# Patient Record
Sex: Female | Born: 1990 | Race: Black or African American | Hispanic: No | Marital: Married
Health system: Midwestern US, Community
[De-identification: ages and names within clinical notes are randomized; demographics above are authoritative.]

## PROBLEM LIST (undated history)

## (undated) DIAGNOSIS — K219 Gastro-esophageal reflux disease without esophagitis: Secondary | ICD-10-CM

## (undated) HISTORY — PX: MOUTH SURGERY: SHX715

## (undated) HISTORY — PX: HERNIA REPAIR: SHX51

## (undated) HISTORY — PX: UPPER GI ENDOSCOPY: SHX6162

---
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
## 2009-03-25 LAB — URINALYSIS W/ RFLX MICROSCOPIC
Bilirubin: NEGATIVE
Blood: NEGATIVE
Glucose: NEGATIVE MG/DL
Ketone: NEGATIVE MG/DL
Leukocyte Esterase: NEGATIVE
Nitrites: NEGATIVE
Protein: NEGATIVE MG/DL
Specific gravity: 1.025 (ref 1.003–1.030)
Urobilinogen: 0.2 EU/DL (ref 0.2–1.0)
pH (UA): 6.5 (ref 5.0–8.0)

## 2009-03-26 LAB — CULTURE, URINE
Culture result:: NO GROWTH
Culture: NO GROWTH

## 2009-03-31 NOTE — ED Provider Notes (Signed)
[facility]                      EMERGENCY DEPARTMENT TREATMENT REPORT           PRELIMINARY (DRAFT) -- FINAL REPORT  in HPF   NAME:  [name] [name], [name] [name]             SEX:            F   DATE:  03/31/2009                     DOB:            05/25/1991   MR#    [id number]                       TIME SEEN       12:52 P   ACCT#  [id number]                      ROOM:           ER  [id number]       cc:           CHIEF COMPLAINT:  Chest pain.       HISTORY:  This is a 18 year old female with intermittent chest pain since   March 2010.  She has been diagnosed with costochondritis.  It flares up   fairly frequently.  She saw her doctor yesterday and was given some type of   cream to apply to her chest wall.  She has not had any cough.  She has not   had any shortness of breath.  There has been no significant injury or   trauma.  She has not traveled anywhere.  She states she was told to come   back today and that they would ultrasound her chest if she was not feeling   better.  She was not able to get there today due to the traffic and   presents for evaluation at this time.       REVIEW OF SYSTEMS:   CONSTITUTIONAL:  No fever, chills, or weight loss.   ENT:  No sore throat, runny nose, or other URI symptoms.   RESPIRATORY:  No cough.  No shortness of breath.   CARDIOVASCULAR:  No chest pressure or palpitations.   GASTROINTESTINAL:  No vomiting, diarrhea, or abdominal pain.   MUSCULOSKELETAL:  Denies any leg pain or swelling.   INTEGUMENTARY:  No rashes.   NEUROLOGICAL:  No headaches, sensory or motor symptoms.       PAST MEDICAL HISTORY:  Reflux.  Last menstrual period was last week.       MEDICATIONS:  Reviewed.       ALLERGIES:  None.       SOCIAL HISTORY:  Negative for tobacco.  No history of travel.       FAMILY HISTORY:  Noncontributory.       PHYSICAL EXAMINATION:   GENERAL:  This is a well-developed female sitting in bed.    VITALS:  Blood pressure 107/63, pulse 64, respirations 18, temperature   98.8, O2 saturation 100% on room air.   HEENT:  Conjunctivae are clear.  Mouth, mucous membranes are pink.  Throat   is clear.   NECK:  Supple, nontender, symmetrical, no masses or JVD, trachea midline.   Thyroid not enlarged, nodular, or tender.   LUNGS:  Clear to auscultation.  Symmetrical  expansion.  She is not   tachypneic.  She is not dyspneic.   HEART:  Regular rate and rhythm.   CHEST:  She has reproducible tenderness over the left anterior chest wall   along the sternocostal margins, which does reproduce her pain and   apparently this is the pain that she has been experiencing now since March,   2010.  I cannot appreciate any obvious acute bony or soft tissue   abnormalities.   ABDOMEN:  Completely soft and nontender.   BACK:  Nontender.   EXTREMITIES:  Warm and dry.  Calves soft and nontender.   SKIN:  Without rash.   NEUROLOGIC:  She is awake, alert, and oriented.       IMPRESSION/MANAGEMENT PLAN:  This is a 18 year old female who presents for   evaluation of recurrent left anterior chest wall pain.  Has been evaluated   for it most recently yesterday.  At this time a chest x-ray will be   obtained to make sure there is no acute pulmonary process.       DIAGNOSTIC STUDIES:  Chest x-ray was read as a normal study per radiology.           COURSE IN THE EMERGENCY DEPARTMENT:  Findings were discussed.  At this time   discussed this does appear to be costochondritis.  I did discuss with her   the need for followup and further evaluation including rheumatologic   evaluation to see why she keeps getting these episodes.  She will need to   take this up with Dr. Doristine Devoid, her family doctor.  We will start her on   ibuprofen.  Rest.  To certainly return at any time worsening or new   concerns.       FINAL DIAGNOSIS:  Evaluation of acute recurrent precordial pain - probable   costochondritis.        DISPOSITION/PLAN:  The patient was discharged home in stable condition to   follow up as above.  The patient was examined by myself and Dr. Dixie Dials, who agrees with my assessment and plan.                               ____________________________   Dixie Dials, M.D.       Dictated By:  Fara Chute, PA-C       My signature above authenticates this document and my orders, the final   diagnosis(es), discharge prescription(s) and instructions in the [facility] record.       st  D:  03/31/2009  T:  04/03/2009  9:39 A   [id number]

## 2009-04-17 LAB — HIV 1/2 AB SCREEN W RFLX CONFIRM
HIV 1/2 Interpretation: NONREACTIVE
HIV1/2 INTERPRETATION, HHIVI: NONREACTIVE

## 2009-04-17 LAB — METABOLIC PANEL, COMPREHENSIVE
A-G Ratio: 1.2 (ref 0.8–1.7)
ALT (SGPT): 30 U/L (ref 30–65)
AST (SGOT): 19 U/L (ref 15–37)
Albumin: 4.4 g/dL (ref 3.4–5.0)
Alk. phosphatase: 81 U/L (ref 50–136)
Anion gap: 11 mmol/L (ref 5–15)
BUN/Creatinine ratio: 13 (ref 12–20)
BUN: 9 MG/DL (ref 7–18)
Bilirubin, total: 0.5 MG/DL (ref 0.1–0.9)
CO2: 24 MMOL/L (ref 21–32)
Calcium: 9.3 MG/DL (ref 8.4–10.4)
Chloride: 101 MMOL/L (ref 100–108)
Creatinine: 0.7 MG/DL (ref 0.6–1.3)
GFR est AA: >60 mL/min/{1.73_m2} (ref >60)
GFR est non-AA: >60 mL/min/{1.73_m2} (ref >60)
Globulin: 3.6 g/dL (ref 2.0–4.0)
Glucose: 89 MG/DL (ref 74–99)
Potassium: 3.7 MMOL/L (ref 3.5–5.5)
Protein, total: 8 g/dL (ref 6.4–8.2)
Sodium: 136 MMOL/L (ref 136–145)

## 2009-04-17 LAB — DRUG SCREEN UR - W/ CONFIRM
AMPHETAMINES: NEGATIVE
BARBITURATES: NEGATIVE
BENZODIAZEPINES: NEGATIVE
COCAINE: NEGATIVE
METHADONE: NEGATIVE
OPIATES: NEGATIVE
PCP(PHENCYCLIDINE): NEGATIVE
THC (TH-CANNABINOL): POSITIVE — AB

## 2009-04-17 LAB — CBC WITH AUTOMATED DIFF
ABS. BASOPHILS: 0 10*3/uL (ref 0.0–0.1)
ABS. LYMPHOCYTES: 1.5 10*3/uL (ref 0.8–3.5)
ABS. MONOCYTES: 0.3 10*3/uL (ref 0–1.0)
ABS. NEUTROPHILS: 2.1 10*3/uL (ref 1.8–8.0)
BASOPHILS: 0 % (ref 0–3)
EOSINOPHILS: 1 % (ref 0–5)
HCT: 35 % — ABNORMAL LOW (ref 36.0–46.0)
HGB: 11.1 g/dL — ABNORMAL LOW (ref 12.0–16.0)
LYMPHOCYTES: 37 % (ref 20–51)
MCH: 30.5 PG (ref 25.0–35.0)
MCHC: 31.8 g/dL (ref 31.0–37.0)
MCV: 95.9 FL (ref 78.0–102.0)
MONOCYTES: 8 % (ref 2–9)
MPV: 9.8 FL (ref 7.4–10.4)
NEUTROPHILS: 54 % (ref 42–75)
PLATELET: 167 10*3/uL (ref 130–400)
RBC: 3.65 M/uL — ABNORMAL LOW (ref 4.10–5.10)
RDW: 13.2 % (ref 11.5–14.5)
WBC: 4 10*3/uL — ABNORMAL LOW (ref 4.5–13.0)

## 2009-04-17 LAB — CK: CK: 85 U/L (ref 21–215)

## 2009-04-17 LAB — IRON PROFILE
Iron % saturation: 45 % (ref 20–50)
Iron: 126 ug/dL (ref 35–150)
TIBC: 283 ug/dL (ref 250–450)

## 2009-04-17 LAB — T3, FREE: Triiodothyronine (T3), free: 3.3 PG/ML (ref 2.3–4.2)

## 2009-04-17 LAB — VITAMIN B12 & FOLATE
Folate: 13.7 ng/mL (ref 5.38–24.0)
Vitamin B12: 512 pg/mL (ref 211–911)

## 2009-04-17 LAB — SED RATE (ESR): Sed rate (ESR): 12 MM/HR (ref 0–20)

## 2009-04-17 LAB — MAGNESIUM: Magnesium: 1.7 MG/DL — ABNORMAL LOW (ref 1.8–2.4)

## 2009-04-17 LAB — T4, FREE: T4, Free: 0.9 NG/DL (ref 0.7–1.5)

## 2009-04-17 LAB — FERRITIN: Ferritin: 45 NG/ML (ref 10–291)

## 2009-04-18 LAB — ANA BY MULTIPLEX FLOW IA, QL
ANA, Direct: NOT DETECTED
ANA: NOT DETECTED

## 2009-04-18 LAB — CULTURE, URINE
Culture result:: NO GROWTH
Culture: NO GROWTH

## 2009-04-18 LAB — CYCLIC CITRUL PEPTIDE AB, IGG: Cyclic Citrullinated Peptide Ab: 13 units (ref <=39)

## 2009-04-18 LAB — VITAMIN D, 25 HYDROXY: Vitamin D 25-Hydroxy: 15 ng/mL — ABNORMAL LOW (ref 30–80)

## 2009-04-19 LAB — CANNABINOIDS CONFIRM., UR: Cannabinoids confirm, urine: POSITIVE

## 2014-02-08 ENCOUNTER — Encounter (HOSPITAL_BASED_OUTPATIENT_CLINIC_OR_DEPARTMENT_OTHER): Payer: Self-pay | Admitting: Emergency Medicine

## 2014-02-08 ENCOUNTER — Emergency Department (HOSPITAL_BASED_OUTPATIENT_CLINIC_OR_DEPARTMENT_OTHER)
Admission: EM | Admit: 2014-02-08 | Discharge: 2014-02-08 | Disposition: A | Discharge status: Home / Self Care | Payer: Self-pay | Attending: Emergency Medicine | Admitting: Emergency Medicine

## 2014-02-08 DIAGNOSIS — L03317 Cellulitis of buttock: Principal | ICD-10-CM

## 2014-02-08 DIAGNOSIS — L0231 Cutaneous abscess of buttock: Secondary | ICD-10-CM | POA: Insufficient documentation

## 2014-02-08 DIAGNOSIS — Z8719 Personal history of other diseases of the digestive system: Secondary | ICD-10-CM | POA: Insufficient documentation

## 2014-02-08 HISTORY — DX: Gastro-esophageal reflux disease without esophagitis: K21.9

## 2014-02-08 LAB — CBG MONITORING, ED: Glucose-Capillary: 81 mg/dL (ref 70–99)

## 2014-02-08 MED ORDER — LIDOCAINE-EPINEPHRINE 2 %-1:100000 IJ SOLN
1.7000 mL | Freq: Once | INTRAMUSCULAR | Status: AC
  Administered 2014-02-08: 1.7 mL via INTRADERMAL
  Filled 2014-02-08: qty 1

## 2014-02-08 MED ORDER — SULFAMETHOXAZOLE-TMP DS 800-160 MG PO TABS
1.0000 | ORAL_TABLET | Freq: Once | ORAL | Status: AC
  Administered 2014-02-08: 1 via ORAL
  Filled 2014-02-08: qty 1

## 2014-02-08 MED ORDER — HYDROCODONE-ACETAMINOPHEN 5-325 MG PO TABS
2.0000 | ORAL_TABLET | Freq: Once | ORAL | Status: AC
  Administered 2014-02-08: 2 via ORAL
  Filled 2014-02-08: qty 2

## 2014-02-08 MED ORDER — HYDROCODONE-ACETAMINOPHEN 5-325 MG PO TABS: ORAL_TABLET | ORAL | Status: DC

## 2014-02-08 MED ORDER — SULFAMETHOXAZOLE-TRIMETHOPRIM 800-160 MG PO TABS: 1.0000 | ORAL_TABLET | Freq: Two times a day (BID) | ORAL | Status: DC

## 2014-02-08 NOTE — Discharge Instructions (Signed)
Take your antibiotics as directed and to completion. You should never have any leftover antibiotics! Push fluids and stay well hydrated.   Perform sitz bath frequently and after every bowel movement.  Do not hesitate to return to the emergency room for any new, worsening or concerning symptoms.  Please obtain primary care using resource guide below. But the minute you were seen in the emergency room and that they will need to obtain records for further outpatient management.   Abscess Care After An abscess (also called a boil or furuncle) is an infected area that contains a collection of pus. Signs and symptoms of an abscess include pain, tenderness, redness, or hardness, or you may feel a moveable soft area under your skin. An abscess can occur anywhere in the body. The infection may spread to surrounding tissues causing cellulitis. A cut (incision) by the surgeon was made over your abscess and the pus was drained out. Gauze may have been packed into the space to provide a drain that will allow the cavity to heal from the inside outwards. The boil may be painful for 5 to 7 days. Most people with a boil do not have high fevers. Your abscess, if seen early, may not have localized, and may not have been lanced. If not, another appointment may be required for this if it does not get better on its own or with medications. HOME CARE INSTRUCTIONS   Only take over-the-counter or prescription medicines for pain, discomfort, or fever as directed by your caregiver.  When you bathe, soak and then remove gauze or iodoform packs at least daily or as directed by your caregiver. You may then wash the wound gently with mild soapy water. Repack with gauze or do as your caregiver directs. SEEK IMMEDIATE MEDICAL CARE IF:   You develop increased pain, swelling, redness, drainage, or bleeding in the wound site.  You develop signs of generalized infection including muscle aches, chills, fever, or a general ill  feeling.  An oral temperature above 102 F (38.9 C) develops, not controlled by medication. See your caregiver for a recheck if you develop any of the symptoms described above. If medications (antibiotics) were prescribed, take them as directed. Document Released: 12/09/2004 Document Revised: 08/15/2011 Document Reviewed: 08/06/2007 [facility] Patient Information 2015 [place], [place]. This information is not intended to replace advice given to you by your health care provider. Make sure you discuss any questions you have with your health care provider.    Emergency Department Resource Guide 1) Find a Doctor and Pay Out of Pocket Although you won't have to find out who is covered by your insurance plan, it is a good idea to ask around and get recommendations. You will then need to call the office and see if the doctor you have chosen will accept you as a new patient and what types of options they offer for patients who are self-pay. Some doctors offer discounts or will set up payment plans for their patients who do not have insurance, but you will need to ask so you aren't surprised when you get to your appointment.  2) Contact Your Local Health Department Not all health departments have doctors that can see patients for sick visits, but many do, so it is worth a call to see if yours does. If you don't know where your local health department is, you can check in your phone book. The CDC also has a tool to help you locate your state's health department, and many state websites  also have listings of all of their local health departments.  3) Find a Walk-in Clinic If your illness is not likely to be very severe or complicated, you may want to try a walk in clinic. These are popping up all over the country in pharmacies, drugstores, and shopping centers. They're usually staffed by nurse practitioners or physician assistants that have been trained to treat common illnesses and complaints. They're usually  fairly quick and inexpensive. However, if you have serious medical issues or chronic medical problems, these are probably not your best option.  No Primary Care Doctor: - Call Health Connect at  [phone] - they can help you locate a primary care doctor that  accepts your insurance, provides certain services, etc. - Physician Referral Service- [phone]  Chronic Pain Problems: Organization         Address  Phone   Notes  [facility]  [phone] Patients need to be referred by their primary care doctor.   Medication Assistance: Organization         Address  Phone   Notes  [facility] Medication [facility] [address] [place] [place], [place] [address] [phone] --Must be a resident of [facility] -- Must have NO insurance coverage whatsoever (no Medicaid/ Medicare, etc.) -- The pt. MUST have a primary care doctor that directs their care regularly and follows them in the community   [facility]  [phone]   [employer]  [phone]    Agencies that provide inexpensive medical care: Organization         Address  Phone   Notes  [facility]  [phone]   [facility]    [phone]   [facility] [address] [place], [place] [address] [phone]   [facility] [address] [place], [place] [phone]   [facility]anned Parenthood    [phone]   [facility]    [phone]   [facility]  [address], [place] Phone:  [phone], Fax:  [phone] Hours of Operation:  9 am - 6 pm, M-F.  Also accepts Medicaid/Medicare and self-pay.  [facility]  [address], [place] Phone: [phone], Fax: [phone]. Hours of Operation:  8:30 am - 5:30 pm, M-F.  Also accepts Medicaid and self-pay.  [facility] [place] [address] [place] Phone: [phone]   [facility] [address] Natasha Bence [place], [place] [phone], Ext. [phone] Mondays & Thursdays: 7-9 AM.  First 15 patients are seen on a first come, first serve basis.    Medicaid-accepting [facility] Providers:  Organization         Address  Phone   Notes  [facility] [address], [place] [phone] Also accepts self-pay patients.  [facility] [address] Laurell Josephs [place], [place]  [phone]   [facility] [address], [place] [phone]   [facility] [address], [place] [phone]   Renaye Rakers [address], [place]   [phone] Only accepts [place] Access [place] patients after they have their name applied to their card.   Self-Pay (no insurance) in [facility]:  [employer]  Notes  Sickle Cell Patients, [facility] [address] [place], [place] [phone]   [facility] [address] [place], [place] [phone]   [facility] [place]  [address], [place] [phone]   [facility]/Dr. Osei-Bonsu  [address] [place] or [address], [place] [phone] Phone number for both [place] and [place] locations is the same.  [facility] [address], [place] [phone]   [facility] [address] [place] or [address] [phone]   [facility] [address] [place] [phone], phone; [phone], fax Sees patients 1st and 3rd Saturday of every month.  Must not qualify for public or private insurance (i.e. Medicaid, Medicare, [place] Health Choice, Veterans' Benefits)  Household income should be no more than 200% of the poverty level The clinic cannot treat you if  you are pregnant or think you are pregnant  Sexually transmitted diseases are not treated at the clinic.    Dental Care: Organization         Address  Phone  Notes  [facility] Department of [facility] [address] [place], [place] [phone] Accepts children up to age 3 who are enrolled in [place] or [place] Health Choice; pregnant women with a Medicaid card; and children who have applied for Medicaid or [place] Health Choice, but were declined, whose parents can pay a reduced fee at time of service.  [facility] Department of [facility]  [address], [place] [phone] Accepts children up to age 65 who are enrolled in [place] or [place] Health Choice; pregnant women with a Medicaid card; and children who have applied for Medicaid or [place] Health Choice, but were declined, whose parents can pay a reduced fee at time of service.  [facility]  [address] [place], [place] [phone] Patients are seen by appointment only. Walk-ins are not accepted. [facility] will see patients 4 years of age and older. Monday - Tuesday (8am-5pm) Most Wednesdays (8:30-5pm) $30 per visit, cash only  [facility]  [address], [facility] [phone] Patients are seen by appointment only. Walk-ins are not accepted. [facility] will see patients 61 years of age and older. One Wednesday Evening (Monthly: Volunteer Based).  $30 per visit, cash only  [employer]  [phone] for adults; Children under age 83, call Graduate Pediatric Dentistry at [phone]. Children aged 60-14, please call [phone] to request a pediatric application.  Dental services are provided in all areas of dental care including fillings, crowns and bridges, complete and partial dentures, implants, gum treatment, root canals, and extractions. Preventive care is also provided. Treatment is  provided to both adults and children. Patients are selected via a lottery and there is often a waiting list.   [facility] [address] [place]  [phone] [email or web address]   [facility] [address] [place], [place] [phone], Ext. [phone] Second and Fourth Thursday of each month, opens at 6:30 AM; Clinic ends at 9 AM.  Patients are seen on a first-come first-served basis, and a limited number are seen during each clinic.   [facility]  [address] Ether Griffins [place], [place] [phone]  Eligibility Requirements You must have lived in [place], [place], or [place] counties for at least the last three months.   You cannot be eligible for state or federal sponsored [place], including [employer], [place], or [employer].   You generally cannot be eligible for healthcare insurance through your employer.    How to apply: Eligibility screenings are held every Tuesday and Wednesday afternoon from 1:00 pm until 4:00 pm. You do not need an appointment for the interview!  [facility] [address] [place], [place] [phone]   [facility] Health Department  [phone]   [facility] Health Department  [phone]   [facility] Health Department  [phone]    Behavioral Health Resources in the Community: Intensive Outpatient Programs Organization         Address  Phone  Notes  [facility] Services [address], [place], [place] [phone]   [facility] Outpatient [address], [place], [place] [phone]   [facility] [address], [place], [place]  [phone]   [facility] Mental Health [address]  [place], [place] [phone] or [phone]   Substance Abuse Resources Organization         Address  Phone  Notes  [facility]  [phone]   [facility]  [phone]   [facility]  [facility]  [phone]   Floydene Flock  [phone]   Residential & Outpatient Substance Abuse Program  [phone]   Psychological Services Organization         Address  Phone  Notes  [facility] Behavioral Health  [phone]   [facility]  [phone]   [facility] [address] [place] [phone] or [phone]    Mobile Crisis Teams Organization         Address  Phone  Notes  [facility], Mobile Crisis Care Unit  [phone]   [facility]  [address] [place], [place] [phone]   Doristine Locks [address] [place] [phone]    Self-Help/Support Groups Organization         Address  Phone             Notes  [facility] [place] - variety of support groups  336- I7437963 Call for more information  Narcotics Anonymous (NA), [facility] [address], [employer] [place]  2 meetings at this location   Statistician         Address  Phone  Notes  [facility] [address],    [place]  [phone]   [facility]  [address] [place] [address], [place], [place] [phone]   [facility] [address] , [place] [phone] Admissions: 8am-3pm M-F  [facility] [address]    [place], [place] [phone]   [facility] [address] Starling Manns [place], [place] [phone]   [facility] [address],  [place], [place] [phone]   [facility] - Intensive Outpatient [address] Laurell Joseph[address], [place], [place] [phone]   [facility] (Addiction Recovery [facility] [address] [place]  [place], [place] [phone] or [phone]   [facility] ([facility]) [address] [place], [place] [phone] Accepts Medicaid  [facility] [place] [place] [address]  [place] [phone] Substance Abuse/Addiction Treatment     [facility] Resources Organization  Address  Phone  Notes  °[facility]  ([phone]) [phone]   °Julie Brannon, PhD [address] [place], [place]   ([phone]) [phone] or (336) [phone]   ° Behavioral   [address] °[place], [employer] ([phone]   °[facility] [address], [place], [place] [phone]) [phone] Insurance/Medicaid/sponsorship through [facility]  °[facility] [address]                                    [place], [place] [phone]) [phone] Therapy/tele-psych/case  °[facility] [address]  ° [place], [place] [phone]    °Dr. Arfeen  ([phone]   °[facility] [place]  [employer] [place] [facility] 1) [address],  °2) [address], [place] °3)  [address], [place] [phone]) [phone] °(336) [phone] ° °(336) [phone]   °[place] Child Abuse Hotline (336) 342-1394 or (336) [phone] (After Hours)    ° ° ° ° °

## 2014-02-08 NOTE — ED Provider Notes (Signed)
CSN: [id number]     Arrival date & time 02/08/14  1400 History   First MD Initiated Contact with Patient 02/08/14 1432     Chief Complaint  Patient presents with  . Abscess     (Consider location/radiation/quality/duration/timing/severity/associated sxs/prior Treatment) HPI  Alysiana Ethridge is a 23 y.o. female complaining of abscess to left buttock worsening over the course of 4 days with severe pain. Patient denies any pain with defecation, fever, chills. States that this happens frequently. Denies history of diabetes.   Past Medical History  Diagnosis Date  . GERD (gastroesophageal reflux disease)    Past Surgical History  Procedure Laterality Date  . Hernia repair    . Mouth surgery     No family history on file. History  Substance Use Topics  . Smoking status: Never Smoker   . Smokeless tobacco: Not on file  . Alcohol Use: No   OB History   Grav Para Term Preterm Abortions TAB SAB Ect Mult Living                 Review of Systems  10 systems reviewed and found to be negative, except as noted in the HPI.   Allergies  Review of patient's allergies indicates no known allergies.  Home Medications   Prior to Admission medications   Medication Sig Start Date End Date Taking? Authorizing Provider  HYDROcodone-acetaminophen (NORCO/VICODIN) 5-325 MG per tablet Take 1-2 tablets by mouth every 6 hours as needed for pain. 02/08/14   Josee Speece, PA-C  sulfamethoxazole-trimethoprim (SEPTRA DS) 800-160 MG per tablet Take 1 tablet by mouth every 12 (twelve) hours. 02/08/14   Keilen Kahl, PA-C   BP 115/78  Pulse 78  Temp(Src) 98.3 F (36.8 C) (Oral)  Resp 18  Ht  (1.626 m)  Wt 104 lb (47.174 kg)  BMI 17.84 kg/m2  SpO2 100%  LMP 01/24/2014 Physical Exam  Nursing note and vitals reviewed. Constitutional: She is oriented to person, place, and time. She appears well-developed and well-nourished. No distress.  HENT:  Head: Normocephalic.  Eyes: Conjunctivae  and EOM are normal.  Cardiovascular: Normal rate.   Pulmonary/Chest: Effort normal. No stridor.  Abdominal: Soft. She exhibits no distension and no mass. There is no tenderness. There is no rebound and no guarding.  Genitourinary:     Musculoskeletal: Normal range of motion.  Neurological: She is alert and oriented to person, place, and time.  Psychiatric: She has a normal mood and affect.    ED Course  Procedures (including critical care time)  INCISION AND DRAINAGE Performed by: Wynetta Emery Consent: Verbal consent obtained. Risks and benefits: risks, benefits and alternatives were discussed Type: abscess  Body area: left gluteuas  Anesthesia: local infiltration  Incision was made with a scalpel.  Local anesthetic: lidocaine 2% with epinephrine  Anesthetic total: 2 ml  Complexity: complex Blunt dissection to break up loculations  Drainage: purulent  Drainage amount: 50 mL  Packing material: None   Patient tolerance: Patient tolerated the procedure well with no immediate complications.    Labs Review Labs Reviewed  CBG MONITORING, ED    Imaging Review No results found.   EKG Interpretation None      MDM   Final diagnoses:  Abscess, gluteal, left    Filed Vitals:   02/08/14 1408  BP: 115/78  Pulse: 78  Temp: 98.3 F (36.8 C)  TempSrc: Oral  Resp: 18  Height:  (1.626 m)  Weight: 104 lb (47.174 kg)  SpO2: 100%  Medications  lidocaine-EPINEPHrine (XYLOCAINE W/EPI) 2 %-1:100000 (with pres) injection 1.7 mL (not administered)  sulfamethoxazole-trimethoprim (BACTRIM DS) 800-160 MG per tablet 1 tablet (not administered)  HYDROcodone-acetaminophen (NORCO/VICODIN) 5-325 MG per tablet 2 tablet (2 tablets Oral Given 02/08/14 1505)    Keasia Dubose is a 23 y.o. female presenting with Left gluteal abscess. I and D. performed. Patient will be given antibiotics states that she gets these frequently.    Evaluation does not show  pathology that would require ongoing emergent intervention or inpatient treatment. Pt is hemodynamically stable and mentating appropriately. Discussed findings and plan with patient/guardian, who agrees with care plan. All questions answered. Return precautions discussed and outpatient follow up given.   New Prescriptions   HYDROCODONE-ACETAMINOPHEN (NORCO/VICODIN) 5-325 MG PER TABLET    Take 1-2 tablets by mouth every 6 hours as needed for pain.   SULFAMETHOXAZOLE-TRIMETHOPRIM (SEPTRA DS) 800-160 MG PER TABLET    Take 1 tablet by mouth every 12 (twelve) hours.         Wynetta Emery, PA-C 02/08/14 1538

## 2014-02-08 NOTE — ED Notes (Signed)
Pt c/o abscess to LT buttock x 4 days

## 2014-02-09 NOTE — ED Provider Notes (Signed)
Medical screening examination/treatment/procedure(s) were performed by non-physician practitioner and as supervising physician I was immediately available for consultation/collaboration.   EKG Interpretation None        Nelle Sayed, MD 02/09/14 0200 

## 2014-03-21 ENCOUNTER — Ambulatory Visit: Payer: Self-pay | Admitting: Physician Assistant

## 2014-04-02 ENCOUNTER — Ambulatory Visit: Payer: Self-pay | Admitting: Physician Assistant

## 2014-04-02 DIAGNOSIS — Z0289 Encounter for other administrative examinations: Secondary | ICD-10-CM

## 2014-04-03 ENCOUNTER — Telehealth: Payer: Self-pay | Admitting: *Deleted

## 2014-04-03 NOTE — Telephone Encounter (Signed)
Pt did not show for appointment 04/02/2014 at 2:30 to establish care. PATIENT HAS NO SHOW TWICE FOR NEW PATIENT APPOINTMENT WITH [facility]; NO FURTHER APPOINTMENTS ARE TO BE SCHEDULED PER PROVIDER.

## 2014-05-05 ENCOUNTER — Telehealth: Payer: Self-pay | Admitting: *Deleted

## 2014-05-06 NOTE — Telephone Encounter (Signed)
ERROR

## 2015-01-29 ENCOUNTER — Emergency Department (HOSPITAL_BASED_OUTPATIENT_CLINIC_OR_DEPARTMENT_OTHER)
Admission: EM | Admit: 2015-01-29 | Discharge: 2015-01-29 | Disposition: A | Discharge status: Home / Self Care | Payer: Self-pay | Attending: Emergency Medicine | Admitting: Emergency Medicine

## 2015-01-29 ENCOUNTER — Encounter (HOSPITAL_BASED_OUTPATIENT_CLINIC_OR_DEPARTMENT_OTHER): Payer: Self-pay | Admitting: *Deleted

## 2015-01-29 DIAGNOSIS — Y929 Unspecified place or not applicable: Secondary | ICD-10-CM | POA: Insufficient documentation

## 2015-01-29 DIAGNOSIS — Y9389 Activity, other specified: Secondary | ICD-10-CM | POA: Insufficient documentation

## 2015-01-29 DIAGNOSIS — S0001XA Abrasion of scalp, initial encounter: Secondary | ICD-10-CM | POA: Insufficient documentation

## 2015-01-29 DIAGNOSIS — S0003XA Contusion of scalp, initial encounter: Secondary | ICD-10-CM | POA: Insufficient documentation

## 2015-01-29 DIAGNOSIS — W228XXA Striking against or struck by other objects, initial encounter: Secondary | ICD-10-CM | POA: Insufficient documentation

## 2015-01-29 DIAGNOSIS — Z8719 Personal history of other diseases of the digestive system: Secondary | ICD-10-CM | POA: Insufficient documentation

## 2015-01-29 DIAGNOSIS — Y998 Other external cause status: Secondary | ICD-10-CM | POA: Insufficient documentation

## 2015-01-29 DIAGNOSIS — Z23 Encounter for immunization: Secondary | ICD-10-CM | POA: Insufficient documentation

## 2015-01-29 MED ORDER — IBUPROFEN 400 MG PO TABS
400.0000 mg | ORAL_TABLET | Freq: Once | ORAL | Status: AC
  Administered 2015-01-29: 400 mg via ORAL
  Filled 2015-01-29: qty 1

## 2015-01-29 MED ORDER — ACETAMINOPHEN 325 MG PO TABS
650.0000 mg | ORAL_TABLET | Freq: Once | ORAL | Status: AC
  Administered 2015-01-29: 650 mg via ORAL
  Filled 2015-01-29: qty 2

## 2015-01-29 MED ORDER — TETANUS-DIPHTH-ACELL PERTUSSIS 5-2.5-18.5 LF-MCG/0.5 IM SUSP
0.5000 mL | Freq: Once | INTRAMUSCULAR | Status: AC
  Administered 2015-01-29: 0.5 mL via INTRAMUSCULAR
  Filled 2015-01-29: qty 0.5
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
## 2015-01-29 NOTE — Discharge Instructions (Signed)
Wash the affected area with soap and water and apply a thin layer of topical antibiotic ointment. Do this every 12 hours.   Do not use rubbing alcohol or hydrogen peroxide.                        Look for signs of infection: if you see redness, if the area becomes warm, if pain increases sharply, there is discharge (pus), if red streaks appear or you develop fever or vomiting, RETURN immediately to the Emergency Department  for a recheck.   For pain control you may take:  800mg  of ibuprofen (that is usually 4 over the counter pills)  3 times a day (take with food) and acetaminophen 975mg  (this is 3 over the counter pills) four times a day. Do not drink alcohol or combine with other medications that have acetaminophen as an ingredient (Read the labels!).    Do not hesitate to return to the emergency room for any new, worsening or concerning symptoms.  Please obtain primary care using resource guide below. Let them know that you were seen in the emergency room and that they will need to obtain records for further outpatient management.    Abrasion An abrasion is a cut or scrape of the skin. Abrasions do not extend through all layers of the skin and most heal within 10 days. It is important to care for your abrasion properly to prevent infection. CAUSES  Most abrasions are caused by falling on, or gliding across, the ground or other surface. When your skin rubs on something, the outer and inner layer of skin rubs off, causing an abrasion. DIAGNOSIS  Your caregiver will be able to diagnose an abrasion during a physical exam.  TREATMENT  Your treatment depends on how large and deep the abrasion is. Generally, your abrasion will be cleaned with water and a mild soap to remove any dirt or debris. An antibiotic ointment may be put over the abrasion to prevent an infection. A bandage (dressing) may be wrapped around the abrasion to keep it from getting dirty.  You may need a tetanus shot if:  You  cannot remember when you had your last tetanus shot.  You have never had a tetanus shot.  The injury broke your skin. If you get a tetanus shot, your arm may swell, get red, and feel warm to the touch. This is common and not a problem. If you need a tetanus shot and you choose not to have one, there is a rare chance of getting tetanus. Sickness from tetanus can be serious.  HOME CARE INSTRUCTIONS   If a dressing was applied, change it at least once a day or as directed by your caregiver. If the bandage sticks, soak it off with warm water.   Wash the area with water and a mild soap to remove all the ointment 2 times a day. Rinse off the soap and pat the area dry with a clean towel.   Reapply any ointment as directed by your caregiver. This will help prevent infection and keep the bandage from sticking. Use gauze over the wound and under the dressing to help keep the bandage from sticking.   Change your dressing right away if it becomes wet or dirty.   Only take over-the-counter or prescription medicines for pain, discomfort, or fever as directed by your caregiver.   Follow up with your caregiver within 24-48 hours for a wound check, or as directed. If you  were not given a wound-check appointment, look closely at your abrasion for redness, swelling, or pus. These are signs of infection. SEEK IMMEDIATE MEDICAL CARE IF:   You have increasing pain in the wound.   You have redness, swelling, or tenderness around the wound.   You have pus coming from the wound.   You have a fever or persistent symptoms for more than 2-3 days.  You have a fever and your symptoms suddenly get worse.  You have a bad smell coming from the wound or dressing.  MAKE SURE YOU:   Understand these instructions.  Will watch your condition.  Will get help right away if you are not doing well or get worse. Document Released: 03/02/2005 Document Revised: 05/09/2012 Document Reviewed: 04/26/2011 [facility]  Patient Information 2015 [place], [place]. This information is not intended to replace advice given to you by your health care provider. Make sure you discuss any questions you have with your health care provider.  Emergency Department Resource Guide 1) Find a Doctor and Pay Out of Pocket Although you won't have to find out who is covered by your insurance plan, it is a good idea to ask around and get recommendations. You will then need to call the office and see if the doctor you have chosen will accept you as a new patient and what types of options they offer for patients who are self-pay. Some doctors offer discounts or will set up payment plans for their patients who do not have insurance, but you will need to ask so you aren't surprised when you get to your appointment.  2) Contact Your Local Health Department Not all health departments have doctors that can see patients for sick visits, but many do, so it is worth a call to see if yours does. If you don't know where your local health department is, you can check in your phone book. The CDC also has a tool to help you locate your state's health department, and many state websites also have listings of all of their local health departments.  3) Find a Walk-in Clinic If your illness is not likely to be very severe or complicated, you may want to try a walk in clinic. These are popping up all over the country in pharmacies, drugstores, and shopping centers. They're usually staffed by nurse practitioners or physician assistants that have been trained to treat common illnesses and complaints. They're usually fairly quick and inexpensive. However, if you have serious medical issues or chronic medical problems, these are probably not your best option.  No Primary Care Doctor: - Call Health Connect at  [phone] - they can help you locate a primary care doctor that  accepts your insurance, provides certain services, etc. - Physician Referral Service-  [phone]  Chronic Pain Problems: Organization         Address  Phone   Notes  [facility]  [phone] Patients need to be referred by their primary care doctor.   Medication Assistance: Organization         Address  Phone   Notes  [facility] Medication [facility] [address] [place] [place], [place] [address] [phone] --Must be a resident of [facility] -- Must have NO insurance coverage whatsoever (no Medicaid/ Medicare, etc.) -- The pt. MUST have a primary care doctor that directs their care regularly and follows them in the community   [facility]ist  [phone]   [address]  [phone]  Agencies that provide inexpensive medical care: Organization         Address  Phone   Notes  [facility]  [phone]   [facility]    [phone]   [facility] [address] [place] [address] [place], [place] [address] [phone]   [facility] [address], [place] [phone]   Planned Parenthood    [phone]   [facility]    [phone]   [facility]  [address], [place] Phone:  [phone], Fax:  [phone] Hours of Operation:  9 am - 6 pm, M-F.  Also accepts Medicaid/Medicare and self-pay.  [facility]  [address], [place] Phone: [phone], Fax: [phone]. Hours of Operation:  8:30 am - 5:30 pm, M-F.  Also accepts Medicaid and self-pay.  [facility] [place] [address] [place] Phone: [phone]   [facility] [address] Natasha Bence [place], [place] [phone], Ext. [phone] Mondays & Thursdays: 7-9 AM.  First 15 patients are seen on a first come, first serve basis.    Medicaid-accepting [facility] Providers:  Organization         Address  Phone   Notes  [facility] [address],  [place] [phone] Also accepts self-pay patients.  [facility] [address] Laurell Josephs [place], [place]  [phone]   [facility] [address], [place] [phone]   [facility] [address] [place] [phone]   Renaye Rakers [address], [place]   [phone] Only accepts [place] Access [place] patients after they have their name applied to their card.   Self-Pay (no insurance) in [facility]:  Organization         Address  Phone   Notes  Sickle Cell Patients, [facility] [address] [place], [place] [phone]   [facility] [address] [place], [place] [phone]   [facility] [place]  [address],  [phone]   [facility]/Dr. Osei-Bonsu  [address], [place] or [address], [place] [phone] Phone number for both [place] and [place] locations is the same.  [facility] [address], [place] [phone]   [facility] [address], [place] or [address] [phone]   [facility] [address] [place] [phone], phone; [phone], fax Sees patients 1st and 3rd Saturday of every month.  Must not qualify for public or private insurance (i.e. Medicaid, Medicare, [place] Health Choice, Veterans' Benefits)  Household income should be no more than 200% of the poverty level The clinic cannot treat you if you are pregnant or think you are pregnant  Sexually transmitted diseases are not treated at the clinic.    Dental Care: Organization         Address  Phone  Notes  [facility] Department of [facility] [address] [place], [place] [phone] Accepts children up to age 22 who are enrolled in  [place] or [place] Health Choice; pregnant women with a Medicaid card; and children who have applied for Medicaid or  [place] Health Choice, but were declined, whose parents can pay a reduced fee at time of service.  [facility] [place]  [address], [place] [phone] Accepts children up to age 25 who are enrolled in [place] or [place] Health Choice; pregnant women with a Medicaid card; and children who have applied for Medicaid or [place] Health Choice, but were declined, whose parents can pay a reduced fee at time of service.  [facility]  [address], [place] [phone] Patients are seen by appointment only. Walk-ins are not accepted. [facility] will see patients 25 years of age and older. Monday - Tuesday (8am-5pm) Most Wednesdays (8:30-5pm) $30 per visit, cash only  [facility]  [address], [facility] [place] [phone] Patients are seen by appointment only. Walk-ins are not accepted. [facility] will see patients 25 years of age and older. One Wednesday Evening (Monthly: Volunteer Based).  $30 per visit, cash only  [facility]  [phone]) [phone] for adults; Children under age 634, call Graduate Pediatric Dentistry at [phone]) [phone]. Children aged 334-14, please call [phone] to request a pediatric application.  Dental services are provided in all areas of dental care including fillings, crowns and bridges, complete and partial dentures, implants, gum treatment, root canals, and extractions. Preventive care is also provided. Treatment is provided to both adults and children. Patients are selected via a lottery and there is often a waiting list.   [facility] [address], [place]  [phone]) [phone] [email or web address]   [facility] [address], [place], [place] [phone], Ext. [phone] Second and Fourth Thursday of each month, opens at 6:30  AM; Clinic ends at 9 AM.  Patients are seen on a first-come first-served basis, and a limited number are seen during each clinic.   [facility]  [address], [place], [place] [phone]) [phone]   Eligibility Requirements You must have lived in [place], [place], or [place] counties for at least the last three months.   You cannot be eligible for state or federal sponsored National Cityhealthcare insurance, including CIGNAVeterans Administration, [place]icaid, or [employer]icare.   You generally cannot be eligible for healthcare insurance through your employer.    How to apply: Eligibility screenings are held every Tuesday and Wednesday afternoon from 1:00 pm until 4:00 pm. You do not need an appointment for the interview!  [facility] [address], [place], [place] [phone]   [facility]  [phone]   [facility]  [phone]   [facility]  [phone]    Behavioral Health Resources in the Community: Intensive Outpatient Programs Organization         Address  Phone  Notes  [facility] [address], [place], [place] [phone]   [facility] [address], [place], [place] [phone]   [facility] [address], [place], [place]  [phone]   [facility] [address],  [place], [place] [phone] or [phone]   Substance Abuse Resources Organization         Address  Phone  Notes  [facility]  [phone]   [facility]  [phone]   [facility]  [phone]   [facility]  [phone]   Residential & Outpatient Substance Abuse Program  [phone]  Psychological Services Organization         Address  Phone  Notes  [facility] Health  [phone]   [facility] Services  [phone]   [facility] [phone] [place] [phone] or  [phone]    Mobile Crisis Teams Organization         Address  Phone  Notes  [facility], Mobile Crisis Care Unit  [phone]   [facility]  [address] [place], [place] [phone]   Doristine Locks [address] [place] [phone]    Self-Help/Support Groups Organization         Address  Phone             Notes  [facility] [place] - variety of support groups  [phone]- [phone] Call for more information  Narcotics Anonymous (NA), [facility] [address], [employer] [place]  2 meetings at this location   Statistician         Address  Phone  Notes  [facility] [address],    [place]  [phone]   [facility]  [address] [place] [address], [place], [place] [phone]   [facility] [address] [place], [place] [phone] Admissions: 8am-3pm M-F  [facility] [address],    [place], [place] [phone]   [facility] [address] [place], [place], [place] [phone]   [facility] [address],  [place], [place] [phone]   [facility] - Intensive Outpatient [address] Laurell [address], [place], [place] [phone]   [facility] [address] [place]  [place], [place] [phone] or [phone]   [facility] ([facility]) [address], [place], [place] [phone] Accepts Medicaid  [facility] [address]  [place] [phone] Substance Abuse/Addiction Treatment   [facility] Organization         Address  Phone  Notes  [facility]  [phone]   Angie Fava, PhD [address] Ervin Knack [place], [place]   [phone] or [phone]   [facility] Behavioral   [address] [place], [place] [phone]   [facility] [address]  [place], [place] [phone] Insurance/Medicaid/sponsorship through [facility] [address]                                    [place], [place] [phone] Therapy/tele-psych/case  [facility] [address], [place] [phone]    Dr. Lolly Mustache  [phone]   [facility] [place]  [facility] Dept. 1) [address] [place] 2) [address], [place] 3)  [address], [place] [phone]  [phone]   [facility] Child Abuse Hotline [phone] or [phone] (After Hours)

## 2015-05-30 ENCOUNTER — Inpatient Hospital Stay
Admit: 2015-05-30 | Discharge: 2015-05-30 | Disposition: A | Discharge status: Home / Self Care | Payer: Self-pay | Attending: Emergency Medicine

## 2015-05-30 DIAGNOSIS — N760 Acute vaginitis: Secondary | ICD-10-CM

## 2015-05-30 LAB — URINALYSIS W/ RFLX MICROSCOPIC
Bilirubin: NEGATIVE
Glucose: NEGATIVE mg/dL
Ketone: NEGATIVE mg/dL
Leukocyte Esterase: NEGATIVE
Nitrites: NEGATIVE
Protein: NEGATIVE mg/dL
Specific gravity: 1.01 (ref 1.005–1.030)
Urobilinogen: 1 EU/dL (ref 0.2–1.0)
pH (UA): 7.5 (ref 5.0–8.0)

## 2015-05-30 LAB — URINE MICROSCOPIC ONLY
RBC: 0 /hpf (ref 0–5)
WBC: 0 /hpf (ref 0–4)

## 2015-05-30 LAB — WET PREP
Wet prep: NONE SEEN
Wet prep: NONE SEEN

## 2015-05-30 LAB — HCG URINE, QL: HCG urine, QL: NEGATIVE

## 2015-05-30 MED ORDER — METRONIDAZOLE 500 MG TAB
500 mg | ORAL_TABLET | Freq: Two times a day (BID) | ORAL | 0 refills | Status: AC
Start: 2015-05-30 — End: 2015-06-06

## 2015-05-30 NOTE — ED Notes (Signed)
I have reviewed discharge instructions with the patient.  The patient verbalized understanding.

## 2015-05-30 NOTE — ED Triage Notes (Signed)
Pt, c/o urinary frequency, vaginal discharge  For 5-6 days

## 2015-05-30 NOTE — ED Provider Notes (Addendum)
HPI Comments: Pt c/o urinary frequency,vaginal discharge with odor. Has had recent new sex partner without condom use. No pelvic or abdominal pain, irregular menses, or fever.     Patient is a 24 y.o. female presenting with frequency and vaginal discharge.   Urinary Frequency    Associated symptoms include frequency and vaginal discharge. Pertinent negatives include no chills, no nausea, no vomiting, no urgency, no flank pain, no abdominal pain and no back pain.   Vaginal Discharge    Associated symptoms include frequency. Pertinent negatives include no fever, no abdominal pain, no nausea, no vomiting and no dysuria.        No past medical history on file.    No past surgical history on file.      No family history on file.    Social History     Social History   ??? Marital status: MARRIED     Spouse name: N/A   ??? Number of children: N/A   ??? Years of education: N/A     Occupational History   ??? Not on file.     Social History Main Topics   ??? Smoking status: Not on file   ??? Smokeless tobacco: Not on file   ??? Alcohol use Not on file   ??? Drug use: Not on file   ??? Sexual activity: Not on file     Other Topics Concern   ??? Not on file     Social History Narrative         ALLERGIES: Review of patient's allergies indicates no known allergies.    Review of Systems   Constitutional: Negative.  Negative for chills and fever.   Respiratory: Negative.  Negative for cough and shortness of breath.    Cardiovascular: Negative.  Negative for chest pain and palpitations.   Gastrointestinal: Negative.  Negative for abdominal pain, nausea and vomiting.   Genitourinary: Positive for frequency and vaginal discharge. Negative for difficulty urinating, dysuria, flank pain, pelvic pain, urgency and vaginal pain.   Musculoskeletal: Negative.  Negative for back pain.   All other systems reviewed and are negative.      Vitals:    05/30/15 0806   BP: 93/64   Pulse: 95   Resp: 16   Temp: 98.2 ??F (36.8 ??C)   SpO2: 100%    Weight: 43.1 kg (95 lb 2 oz)   Height: 5\' 4"  (1.626 m)            Physical Exam   Constitutional: She is oriented to person, place, and time. She appears well-developed and well-nourished. No distress.   HENT:   Head: Normocephalic and atraumatic.   Eyes: Conjunctivae and EOM are normal. Pupils are equal, round, and reactive to light.   Neck: Normal range of motion. Neck supple.   Cardiovascular: Normal rate, regular rhythm and normal heart sounds.    Pulmonary/Chest: Effort normal and breath sounds normal.   Abdominal: Soft. Bowel sounds are normal. There is no tenderness.   Genitourinary: Uterus normal. Vaginal discharge found.   Musculoskeletal: Normal range of motion.   Neurological: She is alert and oriented to person, place, and time. She has normal reflexes.   Skin: Skin is warm and dry. She is not diaphoretic.   Psychiatric: She has a normal mood and affect. Her behavior is normal.   Nursing note and vitals reviewed.       MDM  Number of Diagnoses or Management Options  Diagnosis management comments: Pt instructed to call back  for GC and chlamydia results in 5 days and seek treatment for herself and partner immed if either is positive. Pt receptive of plan       Amount and/or Complexity of Data Reviewed  Clinical lab tests: ordered and reviewed    Risk of Complications, Morbidity, and/or Mortality  Presenting problems: moderate  Diagnostic procedures: moderate  Management options: moderate    Patient Progress  Patient progress: stable    ED Course       Pelvic Exam  Date/Time: 05/30/2015 9:20 AM  Performed by: NP  Procedure duration:  5 minutes.  Exam assisted by:  RN Angelica Chessman.  Type of exam performed: bimanual and speculum.    External genitalia appearance: normal.    Vaginal exam:  odor and discharge.    Cervical exam:  no cervical motion tenderness and os closed.    Specimen(s) collected:  chlamydia and GC (wet prep).  Bimanual exam:  normal.     Patient tolerance: Patient tolerated the procedure well with no immediate complications                             Diagnosis:   1. BV (bacterial vaginosis)          Disposition: home      Follow-up Information     Follow up With Details Comments Contact Info    None Schedule an appointment as soon as possible for a visit  None [phone] Patient stated that they have no PCP      [facility] EMERGENCY DEPT  If symptoms worsen [address] [address]  [phone]          Patient's Medications   Start Taking    METRONIDAZOLE (FLAGYL) 500 MG TABLET    Take 1 Tab by mouth two (2) times a day for 7 days.   Continue Taking    No medications on file   These Medications have changed    No medications on file   Stop Taking    No medications on file

## 2015-06-03 LAB — CHLAMYDIA/NEISSERIA AMPLIFICATION
Chlamydia amplification: NEGATIVE
N. gonorrhoeae amplification: NEGATIVE

## 2016-03-09 ENCOUNTER — Emergency Department (HOSPITAL_BASED_OUTPATIENT_CLINIC_OR_DEPARTMENT_OTHER)
Admission: EM | Admit: 2016-03-09 | Discharge: 2016-03-09 | Disposition: A | Discharge status: Home / Self Care | Payer: No Typology Code available for payment source | Attending: Emergency Medicine | Admitting: Emergency Medicine

## 2016-03-09 ENCOUNTER — Emergency Department (HOSPITAL_BASED_OUTPATIENT_CLINIC_OR_DEPARTMENT_OTHER): Payer: No Typology Code available for payment source

## 2016-03-09 ENCOUNTER — Encounter (HOSPITAL_BASED_OUTPATIENT_CLINIC_OR_DEPARTMENT_OTHER): Payer: Self-pay

## 2016-03-09 DIAGNOSIS — Y92410 Unspecified street and highway as the place of occurrence of the external cause: Secondary | ICD-10-CM | POA: Diagnosis not present

## 2016-03-09 DIAGNOSIS — M25561 Pain in right knee: Secondary | ICD-10-CM | POA: Diagnosis present

## 2016-03-09 DIAGNOSIS — M542 Cervicalgia: Secondary | ICD-10-CM | POA: Diagnosis not present

## 2016-03-09 DIAGNOSIS — Y999 Unspecified external cause status: Secondary | ICD-10-CM | POA: Insufficient documentation

## 2016-03-09 DIAGNOSIS — Y9389 Activity, other specified: Secondary | ICD-10-CM | POA: Insufficient documentation

## 2016-03-09 MED ORDER — IBUPROFEN 800 MG PO TABS
800.0000 mg | ORAL_TABLET | Freq: Once | ORAL | Status: AC
  Administered 2016-03-09: 800 mg via ORAL
  Filled 2016-03-09: qty 1

## 2016-03-09 MED ORDER — OXYCODONE HCL 5 MG PO TABS
2.5000 mg | ORAL_TABLET | Freq: Once | ORAL | Status: AC
  Administered 2016-03-09: 2.5 mg via ORAL
  Filled 2016-03-09 (×2): qty 1

## 2016-03-09 MED ORDER — ACETAMINOPHEN 500 MG PO TABS
1000.0000 mg | ORAL_TABLET | Freq: Once | ORAL | Status: AC
  Administered 2016-03-09: 1000 mg via ORAL
  Filled 2016-03-09: qty 2
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
## 2016-03-09 NOTE — ED Notes (Signed)
MD at bedside. 

## 2016-03-09 NOTE — Discharge Instructions (Signed)
Take 4 over the counter ibuprofen tablets 3 times a day or 2 over-the-counter naproxen tablets twice a day for pain. Also take tylenol 1000mg(2 extra strength) four times a day.    
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
## 2016-03-09 NOTE — ED Notes (Signed)
Pt given d/c instructions as per chart. Verbalizes understanding. No questions. 

## 2016-04-15 ENCOUNTER — Emergency Department (HOSPITAL_BASED_OUTPATIENT_CLINIC_OR_DEPARTMENT_OTHER)
Admission: EM | Admit: 2016-04-15 | Discharge: 2016-04-15 | Disposition: A | Discharge status: Home / Self Care | Payer: Self-pay | Attending: Emergency Medicine | Admitting: Emergency Medicine

## 2016-04-15 ENCOUNTER — Encounter (HOSPITAL_BASED_OUTPATIENT_CLINIC_OR_DEPARTMENT_OTHER): Payer: Self-pay

## 2016-04-15 DIAGNOSIS — R1012 Left upper quadrant pain: Secondary | ICD-10-CM | POA: Insufficient documentation

## 2016-04-15 DIAGNOSIS — R197 Diarrhea, unspecified: Secondary | ICD-10-CM | POA: Insufficient documentation

## 2016-04-15 LAB — CBC WITH DIFFERENTIAL/PLATELET
Basophils Absolute: 0 10*3/uL (ref 0.0–0.1)
Basophils Relative: 0 %
Eosinophils Absolute: 0.1 10*3/uL (ref 0.0–0.7)
Eosinophils Relative: 2 %
HEMATOCRIT: 32.4 % — AB (ref 36.0–46.0)
HEMOGLOBIN: 10.9 g/dL — AB (ref 12.0–15.0)
Lymphocytes Relative: 31 %
Lymphs Abs: 0.9 10*3/uL (ref 0.7–4.0)
MCH: 30.2 pg (ref 26.0–34.0)
MCHC: 33.6 g/dL (ref 30.0–36.0)
MCV: 89.8 fL (ref 78.0–100.0)
MONOS PCT: 9 %
Monocytes Absolute: 0.3 10*3/uL (ref 0.1–1.0)
NEUTROS ABS: 1.7 10*3/uL (ref 1.7–7.7)
NEUTROS PCT: 58 %
Platelets: 196 10*3/uL (ref 150–400)
RBC: 3.61 MIL/uL — ABNORMAL LOW (ref 3.87–5.11)
RDW: 12.7 % (ref 11.5–15.5)
WBC: 3 10*3/uL — ABNORMAL LOW (ref 4.0–10.5)

## 2016-04-15 LAB — COMPREHENSIVE METABOLIC PANEL
ALK PHOS: 39 U/L (ref 38–126)
ALT: 11 U/L — ABNORMAL LOW (ref 14–54)
ANION GAP: 6 (ref 5–15)
AST: 18 U/L (ref 15–41)
Albumin: 4.2 g/dL (ref 3.5–5.0)
BILIRUBIN TOTAL: 0.4 mg/dL (ref 0.3–1.2)
BUN: 13 mg/dL (ref 6–20)
CALCIUM: 9.2 mg/dL (ref 8.9–10.3)
CO2: 23 mmol/L (ref 22–32)
Chloride: 109 mmol/L (ref 101–111)
Creatinine, Ser: 0.59 mg/dL (ref 0.44–1.00)
GFR calc non Af Amer: >60 mL/min (ref >60)
Glucose, Bld: 94 mg/dL (ref 65–99)
Potassium: 3.7 mmol/L (ref 3.5–5.1)
Sodium: 138 mmol/L (ref 135–145)
TOTAL PROTEIN: 7.4 g/dL (ref 6.5–8.1)

## 2016-04-15 LAB — LIPASE, BLOOD: Lipase: 21 U/L (ref 11–51)

## 2016-04-15 LAB — URINALYSIS, ROUTINE W REFLEX MICROSCOPIC
Bilirubin Urine: NEGATIVE
Glucose, UA: NEGATIVE mg/dL
Hgb urine dipstick: NEGATIVE
KETONES UR: NEGATIVE mg/dL
Leukocytes, UA: NEGATIVE
Nitrite: NEGATIVE
PH: 6.5 (ref 5.0–8.0)
Protein, ur: NEGATIVE mg/dL
Specific Gravity, Urine: 1.023 (ref 1.005–1.030)

## 2016-04-15 LAB — PREGNANCY, URINE: PREG TEST UR: NEGATIVE

## 2016-04-15 MED ORDER — FAMOTIDINE IN NACL 20-0.9 MG/50ML-% IV SOLN
20.0000 mg | Freq: Once | INTRAVENOUS | Status: AC
  Administered 2016-04-15: 20 mg via INTRAVENOUS
  Filled 2016-04-15: qty 50

## 2016-04-15 MED ORDER — SODIUM CHLORIDE 0.9 % IV BOLUS (SEPSIS)
1000.0000 mL | Freq: Once | INTRAVENOUS | Status: AC
  Administered 2016-04-15: 1000 mL via INTRAVENOUS

## 2016-04-15 MED ORDER — GI COCKTAIL ~~LOC~~
30.0000 mL | Freq: Once | ORAL | Status: AC
  Administered 2016-04-15: 30 mL via ORAL
  Filled 2016-04-15: qty 30

## 2016-04-15 MED ORDER — FAMOTIDINE 20 MG PO TABS: 20.0000 mg | ORAL_TABLET | Freq: Every day | ORAL | 0 refills | Status: DC

## 2016-04-15 NOTE — ED Triage Notes (Signed)
Pt c/t c/o left side abdominal cramping since Sunday with multiple episodes of diarrhea throughout the day and nausea.  She states her stools were dark at first and are now orange/red.  No antibiotic courses recently, no fevers.

## 2016-04-15 NOTE — Discharge Instructions (Signed)
Your diarrhea may continue for up to 2 weeks, but should be gradually getting better.  Drink plenty of fluids, get rest, and eat blander foods.  Return for worsening symptoms, including fever, worsening pain, intractable vomiting or any other symptoms concerning to you.

## 2016-04-15 NOTE — ED Provider Notes (Signed)
[facility]   CSN: [id number] Arrival date & time: 04/15/16  [id number]     History   Chief Complaint Chief Complaint  Patient presents with  . Abdominal Pain    HPI Wanda Sullivan is a 25 y.o. female.  HPI  25 year old female who presents with abdominal pain and diarrhea. Symptoms started on Sunday. No sick contacts or food exposure. No vomiting, and some mild nausea. No fever. Some urinary frequency, but this is chronic. No dysuria. Pain comes and goes, typically 20-30 minutes after eating, and then goes away. No medications she has been taking for this. Localized to the LUQ and travels towards the umbilicus.   Past Medical History:  Diagnosis Date  . GERD (gastroesophageal reflux disease)     There are no active problems to display for this patient.   Past Surgical History:  Procedure Laterality Date  . HERNIA REPAIR    . MOUTH SURGERY      OB History    No data available       Home Medications    Prior to Admission medications   Medication Sig Start Date End Date Taking? Authorizing Provider  famotidine (PEPCID) 20 MG tablet Take 1 tablet (20 mg total) by mouth daily. 04/15/16   Lavera Guiseana Duo Christino Mcglinchey, MD    Family History No family history on file. Reviewed, noncontributory Social History Social History  Substance Use Topics  . Smoking status: Never Smoker  . Smokeless tobacco: Never Used  . Alcohol use No     Allergies   Patient has no known allergies.   Review of Systems Review of Systems 10/14 systems reviewed and are negative other than those stated in the HPI   Physical Exam Updated Vital Signs BP 114/81 (BP Location: Right Arm)   Pulse 90   Temp 99.5 F (37.5 C) (Oral)   Resp 18   Ht 5\' 3"  (1.6 m)   Wt 102 lb (46.3 kg)   LMP 04/06/2016   SpO2 100%   BMI 18.07 kg/m   Physical Exam Physical Exam  Nursing note and vitals reviewed. Constitutional: Well developed, well nourished, non-toxic, and in no acute  distress Head: Normocephalic and atraumatic.  Mouth/Throat: Oropharynx is clear and moist.  Neck: Normal range of motion. Neck supple.  Cardiovascular: Normal rate and regular rhythm.   Pulmonary/Chest: Effort normal and breath sounds normal.  Abdominal: Soft. There is LuQ tenderness. There is no rebound and no guarding. No flank tenderness Musculoskeletal: Normal range of motion.  Neurological: Alert, no facial droop, fluent speech, moves all extremities symmetrically Skin: Skin is warm and dry.  Psychiatric: Cooperative   ED Treatments / Results  Labs (all labs ordered are listed, but only abnormal results are displayed) Labs Reviewed  CBC WITH DIFFERENTIAL/PLATELET - Abnormal; Notable for the following:       Result Value   WBC 3.0 (*)    RBC 3.61 (*)    Hemoglobin 10.9 (*)    HCT 32.4 (*)    All other components within normal limits  COMPREHENSIVE METABOLIC PANEL - Abnormal; Notable for the following:    ALT 11 (*)    All other components within normal limits  LIPASE, BLOOD  URINALYSIS, ROUTINE W REFLEX MICROSCOPIC (NOT [facility])  PREGNANCY, URINE    EKG  EKG Interpretation None       Radiology No results found.  Procedures Procedures (including critical care time)  Medications Ordered in ED Medications  gi cocktail (Maalox,Lidocaine,Donnatal) (30  mLs Oral Given 04/15/16 0737)  famotidine (PEPCID) IVPB 20 mg premix (20 mg Intravenous New Bag/Given 04/15/16 0738)  sodium chloride 0.9 % bolus 1,000 mL (1,000 mLs Intravenous New Bag/Given 04/15/16 0741)     Initial Impression / Assessment and Plan / ED Course  I have reviewed the triage vital signs and the nursing notes.  Pertinent labs & imaging results that were available during my care of the patient were reviewed by me and considered in my medical decision making (see chart for details).  Clinical Course     Otherwise healthy 25 year old female who presents with left upper quadrant abdominal pain and  diarrhea ongoing over the past 5 days. States that her diarrhea has slowly improved over the past few days. She overall has a soft and benign abdomen, with reassuring vital signs. She does have anemia on her blood work with hemoglobin of 10.9. States that she has extremely heavy periods and just finished her menses a few days ago, which I think this may be the etiology. The remainder of her blood work and her urine are reassuring. She feels improved after GI cocktail, Pepcid and IV fluids. Overall presentation seems consistent with likely benign GI illness. No concerns for serious surgical or infectious intraabdominal processes, such as appendicitis, diverticulitis, pyelonephritis.  I discussed continued supportive care instructions for home. Strict return and follow-up instructions reviewed. She expressed understanding of all discharge instructions and felt comfortable with the plan of care.   Final Clinical Impressions(s) / ED Diagnoses   Final diagnoses:  Left upper quadrant pain  Diarrhea, unspecified type    New Prescriptions New Prescriptions   FAMOTIDINE (PEPCID) 20 MG TABLET    Take 1 tablet (20 mg total) by mouth daily.     Lavera Guiseana Duo Mackenzy Eisenberg, MD 04/15/16 0830

## 2017-06-18 ENCOUNTER — Encounter (HOSPITAL_BASED_OUTPATIENT_CLINIC_OR_DEPARTMENT_OTHER): Payer: Self-pay | Admitting: *Deleted

## 2017-06-18 ENCOUNTER — Other Ambulatory Visit: Payer: Self-pay

## 2017-06-18 ENCOUNTER — Emergency Department (HOSPITAL_BASED_OUTPATIENT_CLINIC_OR_DEPARTMENT_OTHER)
Admission: EM | Admit: 2017-06-18 | Discharge: 2017-06-18 | Disposition: A | Discharge status: Home / Self Care | Payer: Managed Care, Other (non HMO) | Attending: Emergency Medicine | Admitting: Emergency Medicine

## 2017-06-18 DIAGNOSIS — R229 Localized swelling, mass and lump, unspecified: Secondary | ICD-10-CM | POA: Diagnosis present

## 2017-06-18 DIAGNOSIS — L723 Sebaceous cyst: Secondary | ICD-10-CM | POA: Insufficient documentation

## 2017-06-18 DIAGNOSIS — L42 Pityriasis rosea: Secondary | ICD-10-CM | POA: Insufficient documentation

## 2017-06-18 DIAGNOSIS — R21 Rash and other nonspecific skin eruption: Secondary | ICD-10-CM | POA: Insufficient documentation

## 2017-06-18 DIAGNOSIS — Z79899 Other long term (current) drug therapy: Secondary | ICD-10-CM | POA: Diagnosis not present

## 2017-06-18 MED ORDER — TRIAMCINOLONE ACETONIDE 0.1 % EX CREA: 1.0000 "application " | TOPICAL_CREAM | Freq: Two times a day (BID) | CUTANEOUS | 0 refills | Status: DC

## 2017-06-18 MED ORDER — HYDROXYZINE HCL 25 MG PO TABS: 25.0000 mg | ORAL_TABLET | Freq: Four times a day (QID) | ORAL | 0 refills | Status: DC

## 2017-06-18 NOTE — ED Triage Notes (Signed)
Also reports 'round' rash on her back. Itching.

## 2017-06-18 NOTE — ED Triage Notes (Signed)
PT reports 6 weeks of a 'knot' between her eyebrows. Painful. No drainage. Denies fever. Decrease in size and pain over the last 6 weeks.

## 2017-06-18 NOTE — ED Provider Notes (Signed)
[facility] EMERGENCY DEPARTMENT Provider Note   CSN: [id number] Arrival date & time: 06/18/17  1145     History   Chief Complaint Chief Complaint  Patient presents with  . Cyst    HPI Wanda Sullivan is a 27 y.o. female.  Patient presents with 2 skin complaints.  Her main complaint is a cyst between her eyebrows.  This is been ongoing for several weeks.  It had been more red and swollen but has gradually improved over time.  No drainage.  No fevers, nausea or vomiting.  Patient also describes an itchy rash to her back over the past 4 weeks.  Patient started with a oval rash in the right lower part of her back.  After 1-2 weeks she developed other smaller areas were also itchy across her back mainly, but also on her chest.  No treatments prior to arrival.  No fevers or URI symptoms.  No lip swelling or difficulty breathing.        Past Medical History:  Diagnosis Date  . GERD (gastroesophageal reflux disease)     There are no active problems to display for this patient.   Past Surgical History:  Procedure Laterality Date  . HERNIA REPAIR    . MOUTH SURGERY      OB History    No data available       Home Medications    Prior to Admission medications   Medication Sig Start Date End Date Taking? Authorizing Provider  famotidine (PEPCID) 20 MG tablet Take 1 tablet (20 mg total) by mouth daily. 04/15/16   Lavera GuiseLiu, Dana Duo, MD  hydrOXYzine (ATARAX/VISTARIL) 25 MG tablet Take 1 tablet (25 mg total) by mouth every 6 (six) hours. 06/18/17   Renne CriglerGeiple, Taylynn Easton, PA-C  triamcinolone cream (KENALOG) 0.1 % Apply 1 application topically 2 (two) times daily. 06/18/17   Renne CriglerGeiple, Snyder Colavito, PA-C    Family History History reviewed. No pertinent family history.  Social History Social History   Tobacco Use  . Smoking status: Never Smoker  . Smokeless tobacco: Never Used  Substance Use Topics  . Alcohol use: No  . Drug use: No     Allergies   Patient has no known  allergies.   Review of Systems Review of Systems  Constitutional: Negative for fever.  HENT: Negative for facial swelling, rhinorrhea, sore throat and trouble swallowing.   Eyes: Negative for redness.  Respiratory: Negative for cough, shortness of breath, wheezing and stridor.   Cardiovascular: Negative for chest pain.  Gastrointestinal: Negative for abdominal pain, diarrhea, nausea and vomiting.  Genitourinary: Negative for dysuria.  Musculoskeletal: Negative for myalgias.  Skin: Positive for rash.  Neurological: Negative for light-headedness and headaches.  Psychiatric/Behavioral: Negative for confusion.     Physical Exam Updated Vital Signs BP (!) 100/47 (BP Location: Left Arm)   Pulse 83   Temp 98.9 F (37.2 C) (Oral)   Resp 18   Ht 5\' 3"  (1.6 m)   Wt 45.4 kg (100 lb)   LMP 06/05/2017   SpO2 100%   BMI 17.71 kg/m   Physical Exam  Constitutional: She appears well-developed and well-nourished.  HENT:  Head: Normocephalic and atraumatic.  Eyes: Conjunctivae are normal.  Neck: Normal range of motion. Neck supple.  Pulmonary/Chest: No respiratory distress.  Neurological: She is alert.  Skin: Skin is warm and dry.  Patient with 1 cm nodule between eyebrows, not erythematous or tender, consistent with sebaceous cyst.  This was likely inflamed but is now improved.  Patient with an ovoid flesh-colored plaque with some minor scaling to the right lower back consistent with a herald patch.  Patient has several other scattered ovoid areas that are similar over the back, and 1-2 on the chest.  This is consistent with pityriasis rosea.  Less likely ringworm.  Psychiatric: She has a normal mood and affect.  Nursing note and vitals reviewed.    ED Treatments / Results   Procedures Procedures (including critical care time)  Medications Ordered in ED Medications - No data to display   Initial Impression / Assessment and Plan / ED Course  I have reviewed the triage vital  signs and the nursing notes.  Pertinent labs & imaging results that were available during my care of the patient were reviewed by me and considered in my medical decision making (see chart for details).     Patient seen and examined.   Vital signs reviewed and are as follows: BP (!) 100/47 (BP Location: Left Arm)   Pulse 83   Temp 98.9 F (37.2 C) (Oral)   Resp 18   Ht 5\' 3"  (1.6 m)   Wt 45.4 kg (100 lb)   LMP 06/05/2017   SpO2 100%   BMI 17.71 kg/m   Plan: derm follow-up/referral given and hydroxzine/triamcinolone cream for symptom control.   Final Clinical Impressions(s) / ED Diagnoses   Final diagnoses:  Pityriasis rosea  Sebaceous cyst   Sebaceous cyst: Not infected at this time.  No indication for incision and drainage.  No indication for antibiotics.  Derm follow-up given.    Rash on back: Consistent with pityriasis rosea with herald patch present.  Doubt ringworm.  Symptomatic treatment for this, will likely resolve with time.   ED Discharge Orders        Ordered    hydrOXYzine (ATARAX/VISTARIL) 25 MG tablet  Every 6 hours     06/18/17 1214    triamcinolone cream (KENALOG) 0.1 %  2 times daily     06/18/17 1214       Renne Crigler, PA-C 06/18/17 1221    Charlynne Pander, MD 06/18/17 [phone]

## 2017-06-18 NOTE — Discharge Instructions (Signed)
Please read and follow all provided instructions.  Your diagnoses today include:  1. Pityriasis rosea   2. Sebaceous cyst     Tests performed today include:  Vital signs. See below for your results today.   Medications prescribed:   Hydroxyzine - antihistamine  You can find this medication over-the-counter.   This medication will make you drowsy. DO NOT drive or perform any activities that require you to be awake and alert if taking this.   Triamcinolone (Kenalog) cream - topical steroid medication for skin reaction  Home care instructions:  Follow any educational materials contained in this packet.  Follow-up instructions: Please follow-up with your primary care provider as needed for further evaluation of your symptoms.  You may also follow-up with 1 of the dermatologists listed.  Return instructions:   Please return to the Emergency Department if you experience worsening symptoms.   Please return if you have any other emergent concerns.  Additional Information:  Your vital signs today were: BP (!) 100/47 (BP Location: Left Arm)    Pulse 83    Temp 98.9 F (37.2 C) (Oral)    Resp 18    Ht 5\' 3"  (1.6 m)    Wt 45.4 kg (100 lb)    LMP 06/05/2017    SpO2 100%    BMI 17.71 kg/m  If your blood pressure (BP) was elevated above 135/85 this visit, please have this repeated by your doctor within one month. ---------------
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
## 2018-02-09 ENCOUNTER — Other Ambulatory Visit: Payer: Self-pay

## 2018-02-09 ENCOUNTER — Encounter (HOSPITAL_BASED_OUTPATIENT_CLINIC_OR_DEPARTMENT_OTHER): Payer: Self-pay | Admitting: Emergency Medicine

## 2018-02-09 ENCOUNTER — Emergency Department (HOSPITAL_BASED_OUTPATIENT_CLINIC_OR_DEPARTMENT_OTHER)
Admission: EM | Admit: 2018-02-09 | Discharge: 2018-02-09 | Disposition: A | Discharge status: Home / Self Care | Payer: Managed Care, Other (non HMO) | Attending: Emergency Medicine | Admitting: Emergency Medicine

## 2018-02-09 DIAGNOSIS — H60591 Other noninfective acute otitis externa, right ear: Secondary | ICD-10-CM | POA: Diagnosis not present

## 2018-02-09 DIAGNOSIS — H60321 Hemorrhagic otitis externa, right ear: Secondary | ICD-10-CM

## 2018-02-09 DIAGNOSIS — Z79899 Other long term (current) drug therapy: Secondary | ICD-10-CM | POA: Diagnosis not present

## 2018-02-09 DIAGNOSIS — F17290 Nicotine dependence, other tobacco product, uncomplicated: Secondary | ICD-10-CM | POA: Insufficient documentation

## 2018-02-09 DIAGNOSIS — H9201 Otalgia, right ear: Secondary | ICD-10-CM | POA: Diagnosis present

## 2018-02-09 MED ORDER — NEOMYCIN-POLYMYXIN-HC 3.5-10000-1 OT SUSP: 4.0000 [drp] | Freq: Three times a day (TID) | OTIC | 0 refills | Status: DC

## 2018-02-09 MED ORDER — NEOMYCIN-POLYMYXIN-HC 3.5-10000-1 OT SUSP: 4.0000 [drp] | Freq: Three times a day (TID) | OTIC | 0 refills | Status: AC

## 2018-02-09 MED FILL — NEO/POLYMYXIN/HC EAR SUSP: 3.5-10000-1 | 17 days supply | Qty: 10 | Fill #0

## 2018-02-09 NOTE — ED Provider Notes (Signed)
[facility] EMERGENCY DEPARTMENT Provider Note   CSN: [id number] Arrival date & time: 02/09/18  0800     History   Chief Complaint Chief Complaint  Patient presents with  . Otalgia    HPI Wanda Sullivan is a 27 y.o. female.  HPI    Patient reports is been present for 1 week.  Reports that she has a history of prior ear infections.  Reports the pain is severe, constant over the last week, and worsening.  It is worse with movement of her right ear and palpation around the ear.  Denies any ear trauma, fevers.  Reports she has chronic nasal congestion which is unchanged.  Reports that she has had drainage from the right ear, including blood.  Reports tried ibuprofen with some relief.   Past Medical History:  Diagnosis Date  . GERD (gastroesophageal reflux disease)     There are no active problems to display for this patient.   Past Surgical History:  Procedure Laterality Date  . HERNIA REPAIR    . MOUTH SURGERY       OB History   None      Home Medications    Prior to Admission medications   Medication Sig Start Date End Date Taking? Authorizing Provider  famotidine (PEPCID) 20 MG tablet Take 1 tablet (20 mg total) by mouth daily. 04/15/16   Lavera Guise, MD  hydrOXYzine (ATARAX/VISTARIL) 25 MG tablet Take 1 tablet (25 mg total) by mouth every 6 (six) hours. 06/18/17   Renne Crigler, PA-C  neomycin-polymyxin-hydrocortisone (CORTISPORIN) 3.5-10000-1 OTIC suspension Place 4 drops into the right ear 3 (three) times daily for 10 days. 02/09/18 02/19/18  Alvira Monday, MD  triamcinolone cream (KENALOG) 0.1 % Apply 1 application topically 2 (two) times daily. 06/18/17   Renne Crigler, PA-C    Family History History reviewed. No pertinent family history.  Social History Social History   Tobacco Use  . Smoking status: Current Every Day Smoker    Types: Cigars  . Smokeless tobacco: Never Used  Substance Use Topics  . Alcohol use: No  . Drug use: No      Allergies   Patient has no known allergies.   Review of Systems Review of Systems  Constitutional: Negative for fever.  HENT: Positive for congestion (chronic), ear discharge and ear pain. Negative for hearing loss.   Respiratory: Negative for cough.      Physical Exam Updated Vital Signs BP (!) 117/95 (BP Location: Left Arm)   Pulse 72   Temp 98.7 F (37.1 C) (Oral)   Resp 18   Ht 5\' 4"  (1.626 m)   Wt 47.4 kg   LMP 02/02/2018   SpO2 100%   BMI 17.94 kg/m   Physical Exam  Constitutional: She appears well-developed and well-nourished. No distress.  HENT:  Head: Normocephalic and atraumatic.  Mouth/Throat: Oropharynx is clear and moist.  Left ear WNL Right ear pain with ear movement, inflammation of canal, with exudate, TM intact very small clear effusion.   Eyes: Conjunctivae are normal.  Neck: Neck supple.  Cardiovascular: Normal rate.  Pulmonary/Chest: Effort normal. No respiratory distress.  Musculoskeletal: She exhibits no edema.  Neurological: She is alert.  Skin: Skin is warm and dry.  Psychiatric: She has a normal mood and affect.  Nursing note and vitals reviewed.    ED Treatments / Results  Labs (all labs ordered are listed, but only abnormal results are displayed) Labs Reviewed - No data to display  EKG None  Radiology No results found.  Procedures Procedures (including critical care time)  Medications Ordered in ED Medications - No data to display   Initial Impression / Assessment and Plan / ED Course  I have reviewed the triage vital signs and the nursing notes.  Pertinent labs & imaging results that were available during my care of the patient were reviewed by me and considered in my medical decision making (see chart for details).    27yo female presents with concern for right-sided otalgia.  Exam with right-sided canal inflammation, exudate, evidence of prior blood, with concern for otitis externa.  No sign of TM perforation.  There is no sign of otitis media on my exam, no fever.  Discussed reasons to return with patient including fever.  Given corticosporin drops. Patient discharged in stable condition with understanding of reasons to return.   Final Clinical Impressions(s) / ED Diagnoses   Final diagnoses:  Acute hemorrhagic otitis externa of right ear    ED Discharge Orders         Ordered    neomycin-polymyxin-hydrocortisone (CORTISPORIN) 3.5-10000-1 OTIC suspension  3 times daily,   Status:  Discontinued     02/09/18 0829    neomycin-polymyxin-hydrocortisone (CORTISPORIN) 3.5-10000-1 OTIC suspension  3 times daily     02/09/18 9147           Alvira Monday, MD 02/09/18 [phone]

## 2018-02-09 NOTE — ED Triage Notes (Addendum)
Reports right ear pain x 1 week.  States this has started bleeding.  Denies difficulty with hearing at present.

## 2018-08-02 ENCOUNTER — Emergency Department (HOSPITAL_BASED_OUTPATIENT_CLINIC_OR_DEPARTMENT_OTHER)
Admission: EM | Admit: 2018-08-02 | Discharge: 2018-08-02 | Disposition: A | Discharge status: Home / Self Care | Payer: Managed Care, Other (non HMO) | Attending: Emergency Medicine | Admitting: Emergency Medicine

## 2018-08-02 ENCOUNTER — Encounter (HOSPITAL_BASED_OUTPATIENT_CLINIC_OR_DEPARTMENT_OTHER): Payer: Self-pay

## 2018-08-02 ENCOUNTER — Other Ambulatory Visit: Payer: Self-pay

## 2018-08-02 DIAGNOSIS — R220 Localized swelling, mass and lump, head: Secondary | ICD-10-CM | POA: Diagnosis present

## 2018-08-02 DIAGNOSIS — L0201 Cutaneous abscess of face: Secondary | ICD-10-CM | POA: Insufficient documentation

## 2018-08-02 DIAGNOSIS — F17290 Nicotine dependence, other tobacco product, uncomplicated: Secondary | ICD-10-CM | POA: Insufficient documentation

## 2018-08-02 MED ORDER — LIDOCAINE HCL (PF) 1 % IJ SOLN
2.0000 mL | Freq: Once | INTRAMUSCULAR | Status: AC
  Administered 2018-08-02: 2 mL
  Filled 2018-08-02: qty 5

## 2018-08-02 MED ORDER — AMOXICILLIN-POT CLAVULANATE 875-125 MG PO TABS: 1.0000 | ORAL_TABLET | Freq: Two times a day (BID) | ORAL | 0 refills | Status: DC

## 2018-08-02 MED ORDER — SULFAMETHOXAZOLE-TRIMETHOPRIM 800-160 MG PO TABS: 1.0000 | ORAL_TABLET | Freq: Two times a day (BID) | ORAL | 0 refills | Status: DC

## 2018-08-02 MED FILL — AMOX-CLAV 875-125 MG TABLET: 875-125 | 7 days supply | Qty: 14 | Fill #0

## 2018-08-02 MED FILL — SULFAMETHOXAZOLE-TMP DS TAB: 800-160 | 7 days supply | Qty: 14 | Fill #0

## 2018-08-02 NOTE — ED Provider Notes (Signed)
[facility] EMERGENCY DEPARTMENT Provider Note   CSN: [id number] Arrival date & time: 08/02/18  1533    History   Chief Complaint Chief Complaint  Patient presents with  . Facial Swelling    HPI Wanda Sullivan is a 28 y.o. female.     Patient states she noted a small "pimple" under her left eye yesterday. This morning she awoke with swelling around the eye, involving the soft tissue below the eye and the upper lid. She states it is uncomfortable when she blinks. No pain with moving the the eye from side to side or looking down. Mild discomfort when looking up. No visual disturbance.    Abscess  Location:  Face Facial abscess location:  L cheek Abscess quality: fluctuance, induration and painful   Red streaking: no   Progression:  Worsening Pain details:    Quality:  Pressure and throbbing   Severity:  Moderate   Duration:  1 day Chronicity:  New Context: not diabetes, not immunosuppression and not skin injury   Relieved by:  None tried Ineffective treatments:  None tried   Past Medical History:  Diagnosis Date  . GERD (gastroesophageal reflux disease)     There are no active problems to display for this patient.   Past Surgical History:  Procedure Laterality Date  . HERNIA REPAIR    . MOUTH SURGERY       OB History   No obstetric history on file.      Home Medications    Prior to Admission medications   Medication Sig Start Date End Date Taking? Authorizing Provider  famotidine (PEPCID) 20 MG tablet Take 1 tablet (20 mg total) by mouth daily. 04/15/16   Lavera Guise, MD  hydrOXYzine (ATARAX/VISTARIL) 25 MG tablet Take 1 tablet (25 mg total) by mouth every 6 (six) hours. 06/18/17   Renne Crigler, PA-C  triamcinolone cream (KENALOG) 0.1 % Apply 1 application topically 2 (two) times daily. 06/18/17   Renne Crigler, PA-C    Family History No family history on file.  Social History Social History   Tobacco Use  . Smoking status:  Current Every Day Smoker    Types: Cigars  . Smokeless tobacco: Never Used  Substance Use Topics  . Alcohol use: No  . Drug use: No     Allergies   Patient has no known allergies.   Review of Systems Review of Systems  Eyes: Negative for photophobia, pain, redness and visual disturbance.  All other systems reviewed and are negative.    Physical Exam Updated Vital Signs BP 107/62 (BP Location: Left Arm)   Pulse 70   Temp 98.2 F (36.8 C) (Oral)   Resp 18   Ht 5\' 3"  (1.6 m)   Wt 47.2 kg   LMP 07/24/2018   SpO2 100%   BMI 18.42 kg/m   Physical Exam Vitals signs and nursing note reviewed.  Constitutional:      Appearance: Normal appearance.  Eyes:     Comments: Edema of soft tissue below left eye, with induration and fluctuance. Edema of upper lid noted. Mild erythema.  Neck:     Musculoskeletal: Normal range of motion and neck supple.  Cardiovascular:     Rate and Rhythm: Normal rate and regular rhythm.  Pulmonary:     Effort: Pulmonary effort is normal.     Breath sounds: Normal breath sounds.  Skin:    General: Skin is warm and dry.  Neurological:     Mental Status: She  is alert and oriented to person, place, and time.  Psychiatric:        Mood and Affect: Mood normal.      ED Treatments / Results  Labs (all labs ordered are listed, but only abnormal results are displayed) Labs Reviewed - No data to display  EKG None  Radiology No results found.  Procedures Procedures (including critical care time)  Bedside ultrasound performed by Dr. Clayborne Dana. Small fluid collection (.75x.75 cm) noted.  INCISION AND DRAINAGE Performed by: Jimmye Norman Consent: Verbal consent obtained. Risks and benefits: risks, benefits and alternatives were discussed Type: abscess  Body area: left cheek (face)  Anesthesia: local infiltration  Local anesthetic: lidocaine 1%   Anesthetic total: 0.5 ml  Needle aspiration  Drainage: purulent  Drainage amount:  scant  Patient tolerance: Patient tolerated the procedure well with no immediate complications.    Medications Ordered in ED Medications - No data to display   Initial Impression / Assessment and Plan / ED Course  I have reviewed the triage vital signs and the nursing notes.  Pertinent labs & imaging results that were available during my care of the patient were reviewed by me and considered in my medical decision making (see chart for details).        Patient with skin abscess. Incision and drainage performed in the ED today. Wound recheck in 2 days. Supportive care and return precautions discussed.  Pt sent home with bactrim and augmentin. The patient appears reasonably screened and/or stabilized for discharge and I doubt any other emergent medical condition requiring further screening, evaluation, or treatment in the ED prior to discharge.   Final Clinical Impressions(s) / ED Diagnoses   Final diagnoses:  Facial abscess    ED Discharge Orders         Ordered    sulfamethoxazole-trimethoprim (BACTRIM DS,SEPTRA DS) 800-160 MG tablet  2 times daily     08/02/18 1650    amoxicillin-clavulanate (AUGMENTIN) 875-125 MG tablet  2 times daily     08/02/18 1650           Felicie Morn, NP 08/02/18 1701    Mesner, Barbara Cower, MD 08/02/18 2350

## 2018-08-02 NOTE — ED Triage Notes (Signed)
Pt states she woke this am with swelling around left eye-denies injury-NAD-steady gait

## 2018-08-02 NOTE — ED Provider Notes (Signed)
  Physical Exam  BP 107/62 (BP Location: Left Arm)   Pulse 70   Temp 98.2 F (36.8 C) (Oral)   Resp 18   Ht 5\' 3"  (1.6 m)   Wt 47.2 kg   LMP 07/24/2018   SpO2 100%   BMI 18.42 kg/m   Physical Exam Skin:    Comments: 0.5 cm indurated, erythematous warm area midline under left eye approximately 2 cm below lash. Whole lower eyelid edematous with mild erythema. No pain with EOM. No proptosis. PERRL.     ED Course/Procedures     Ultrasound ED Soft Tissue Date/Time: 08/02/2018 4:42 PM Performed by: Marily Memos, MD Authorized by: Marily Memos, MD   Procedure details:    Indications: localization of abscess and evaluate for cellulitis     Transverse view:  Visualized   Longitudinal view:  Visualized   Images: archived     Limitations:  Patient compliance Location:    Location: face     Side:  Left Findings:     abscess present    cellulitis present    MDM   28 year old female with a very small abscess but significant surrounding periorbital edema.  No evidence of post septal cellulitis or deep abscess.  Ultrasound localized a 0.5 cm fluid collection that is easily palpable from the skin.  In the interest of not causing a large scar or defect with such a small abscess we will plan to start antibiotics and needle aspirate the abscess to give a tract for it to drain with close emergency department follow-up if not improving or with any worsening.        Jenalee Trevizo, Barbara Cower, MD 08/02/18 2350

## 2018-08-09 ENCOUNTER — Encounter (HOSPITAL_BASED_OUTPATIENT_CLINIC_OR_DEPARTMENT_OTHER): Payer: Self-pay | Admitting: Emergency Medicine

## 2018-08-09 ENCOUNTER — Emergency Department (HOSPITAL_BASED_OUTPATIENT_CLINIC_OR_DEPARTMENT_OTHER)
Admission: EM | Admit: 2018-08-09 | Discharge: 2018-08-09 | Disposition: A | Discharge status: Home / Self Care | Payer: Managed Care, Other (non HMO) | Attending: Emergency Medicine | Admitting: Emergency Medicine

## 2018-08-09 ENCOUNTER — Other Ambulatory Visit: Payer: Self-pay

## 2018-08-09 DIAGNOSIS — R109 Unspecified abdominal pain: Secondary | ICD-10-CM | POA: Insufficient documentation

## 2018-08-09 DIAGNOSIS — L0201 Cutaneous abscess of face: Secondary | ICD-10-CM

## 2018-08-09 DIAGNOSIS — K921 Melena: Secondary | ICD-10-CM | POA: Insufficient documentation

## 2018-08-09 DIAGNOSIS — R197 Diarrhea, unspecified: Secondary | ICD-10-CM | POA: Diagnosis present

## 2018-08-09 DIAGNOSIS — R112 Nausea with vomiting, unspecified: Secondary | ICD-10-CM | POA: Insufficient documentation

## 2018-08-09 DIAGNOSIS — F17290 Nicotine dependence, other tobacco product, uncomplicated: Secondary | ICD-10-CM | POA: Insufficient documentation

## 2018-08-09 DIAGNOSIS — Z79899 Other long term (current) drug therapy: Secondary | ICD-10-CM | POA: Insufficient documentation

## 2018-08-09 LAB — GASTROINTESTINAL PANEL BY PCR, STOOL (REPLACES STOOL CULTURE)
Adenovirus F40/41: NOT DETECTED
Astrovirus: NOT DETECTED
CAMPYLOBACTER SPECIES: NOT DETECTED
CYCLOSPORA CAYETANENSIS: NOT DETECTED
Cryptosporidium: NOT DETECTED
ENTEROAGGREGATIVE E COLI (EAEC): NOT DETECTED
Entamoeba histolytica: NOT DETECTED
Enteropathogenic E coli (EPEC): NOT DETECTED
Enterotoxigenic E coli (ETEC): NOT DETECTED
Giardia lamblia: NOT DETECTED
Norovirus GI/GII: NOT DETECTED
Plesimonas shigelloides: NOT DETECTED
ROTAVIRUS A: NOT DETECTED
SALMONELLA SPECIES: NOT DETECTED
SHIGA LIKE TOXIN PRODUCING E COLI (STEC): NOT DETECTED
SHIGELLA/ENTEROINVASIVE E COLI (EIEC): NOT DETECTED
Sapovirus (I, II, IV, and V): NOT DETECTED
VIBRIO SPECIES: NOT DETECTED
Vibrio cholerae: NOT DETECTED
Yersinia enterocolitica: NOT DETECTED

## 2018-08-09 LAB — CBC WITH DIFFERENTIAL/PLATELET
Abs Immature Granulocytes: 0.01 10*3/uL (ref 0.00–0.07)
Basophils Absolute: 0 10*3/uL (ref 0.0–0.1)
Basophils Relative: 1 %
Eosinophils Absolute: 0 10*3/uL (ref 0.0–0.5)
Eosinophils Relative: 2 %
HEMATOCRIT: 33.9 % — AB (ref 36.0–46.0)
HEMOGLOBIN: 10.8 g/dL — AB (ref 12.0–15.0)
Immature Granulocytes: 0 %
LYMPHS PCT: 44 %
Lymphs Abs: 1.2 10*3/uL (ref 0.7–4.0)
MCH: 29.5 pg (ref 26.0–34.0)
MCHC: 31.9 g/dL (ref 30.0–36.0)
MCV: 92.6 fL (ref 80.0–100.0)
Monocytes Absolute: 0.3 10*3/uL (ref 0.1–1.0)
Monocytes Relative: 11 %
Neutro Abs: 1.1 10*3/uL — ABNORMAL LOW (ref 1.7–7.7)
Neutrophils Relative %: 42 %
Platelets: 175 10*3/uL (ref 150–400)
RBC: 3.66 MIL/uL — ABNORMAL LOW (ref 3.87–5.11)
RDW: 12.9 % (ref 11.5–15.5)
WBC: 2.7 10*3/uL — ABNORMAL LOW (ref 4.0–10.5)
nRBC: 0 % (ref 0.0–0.2)

## 2018-08-09 LAB — COMPREHENSIVE METABOLIC PANEL
ALT: 12 U/L (ref 0–44)
AST: 20 U/L (ref 15–41)
Albumin: 3.9 g/dL (ref 3.5–5.0)
Alkaline Phosphatase: 51 U/L (ref 38–126)
Anion gap: 7 (ref 5–15)
BUN: 8 mg/dL (ref 6–20)
CO2: 23 mmol/L (ref 22–32)
Calcium: 9 mg/dL (ref 8.9–10.3)
Chloride: 108 mmol/L (ref 98–111)
Creatinine, Ser: 0.61 mg/dL (ref 0.44–1.00)
GFR calc non Af Amer: >60 mL/min (ref >60)
Glucose, Bld: 91 mg/dL (ref 70–99)
Potassium: 3.5 mmol/L (ref 3.5–5.1)
Sodium: 138 mmol/L (ref 135–145)
Total Bilirubin: 0.4 mg/dL (ref 0.3–1.2)
Total Protein: 7.1 g/dL (ref 6.5–8.1)

## 2018-08-09 LAB — LIPASE, BLOOD: Lipase: 28 U/L (ref 11–51)

## 2018-08-09 LAB — PREGNANCY, URINE: Preg Test, Ur: NEGATIVE

## 2018-08-09 LAB — OCCULT BLOOD X 1 CARD TO LAB, STOOL: Fecal Occult Bld: POSITIVE — AB

## 2018-08-09 MED ORDER — DOXYCYCLINE HYCLATE 100 MG PO CAPS: 100.0000 mg | ORAL_CAPSULE | Freq: Two times a day (BID) | ORAL | 0 refills | Status: AC

## 2018-08-09 MED FILL — DOXYCYCLINE HYCLATE 100 MG: 100 | 7 days supply | Qty: 14 | Fill #0

## 2018-08-09 NOTE — ED Notes (Signed)
ED Provider at bedside. 

## 2018-08-09 NOTE — Discharge Instructions (Signed)
Start taking probiotics/lactobacillus 3 times a day with meals. Stop taking AUGMENTIN and BACTRIM and start taking DOXYCYLINE Contact ENT clinic for a follow up visit as soon as possible. If diarrhea doesn't get better on its own, schedule a follow up appointment with your gastroenterologist. Return to ER if your facial swelling worsens, you have severe abdominal pain, the bloody diarrhea worsens, or if you develop fever.

## 2018-08-09 NOTE — ED Triage Notes (Signed)
Pt seen last week for facial abscess.  Given rx for Augmentin and Bactrim.  She states that 3 days into the abx she began having abd pain and bloody diarrhea.  She stopped the abx but is still having bloody diarrhea.

## 2018-08-09 NOTE — ED Provider Notes (Signed)
[facility] EMERGENCY DEPARTMENT Provider Note   CSN: [id number] Arrival date & time: 08/09/18  0810    History   Chief Complaint Chief Complaint  Patient presents with  . GI Bleeding    HPI Wanda Sullivan is a 28 y.o. female.     29 year old female with history of GERD who presents with bloody stools.  Patient was evaluated here on 2/27 for left facial abscess.  She had a needle drainage and was started on Augmentin and Bactrim.  She took the medications for approximately 3 days at which point she woke up in the morning with crampy abdominal pain and had an episode of loose, bloody stool.  She had several more episodes of bloody diarrhea and she stopped the medication 2 days ago.  Over the past 2 days, her symptoms have slowly been improving with no blood in most recent bowel movement and improvement in her abdominal cramping.  She states that she has had some associated with nausea and vomiting, last episode was yesterday, no vomiting or bloody stools today.  She is not aware of any fevers and denies any vaginal bleeding or urinary symptoms.  She is off all antibiotics currently.  Her periorbital edema has improved but the bump on her face has remained the same.  She denies any recent travel or sick contacts.  No anticoagulant use.  The history is provided by the patient.    Past Medical History:  Diagnosis Date  . GERD (gastroesophageal reflux disease)     There are no active problems to display for this patient.   Past Surgical History:  Procedure Laterality Date  . HERNIA REPAIR    . MOUTH SURGERY    . UPPER GI ENDOSCOPY       OB History   No obstetric history on file.      Home Medications    Prior to Admission medications   Medication Sig Start Date End Date Taking? Authorizing Provider  doxycycline (VIBRAMYCIN) 100 MG capsule Take 1 capsule (100 mg total) by mouth 2 (two) times daily for 7 days. 08/09/18 08/16/18  Little, Ambrose [place], MD    famotidine (PEPCID) 20 MG tablet Take 1 tablet (20 mg total) by mouth daily. 04/15/16   Lavera Guise, MD  hydrOXYzine (ATARAX/VISTARIL) 25 MG tablet Take 1 tablet (25 mg total) by mouth every 6 (six) hours. 06/18/17   Renne Crigler, PA-C  triamcinolone cream (KENALOG) 0.1 % Apply 1 application topically 2 (two) times daily. 06/18/17   Renne Crigler, PA-C    Family History No family history on file.  Social History Social History   Tobacco Use  . Smoking status: Current Every Day Smoker    Types: Cigars  . Smokeless tobacco: Never Used  Substance Use Topics  . Alcohol use: No  . Drug use: No     Allergies   Patient has no known allergies.   Review of Systems Review of Systems All other systems reviewed and are negative except that which was mentioned in HPI   Physical Exam Updated Vital Signs BP 107/67 (BP Location: Left Arm)   Pulse 63   Temp 98.5 F (36.9 C) (Oral)   Resp 16   Ht 5\' 3"  (1.6 m)   Wt 47.6 kg   LMP 07/24/2018   SpO2 100%   BMI 18.60 kg/m   Physical Exam Vitals signs and nursing note reviewed.  Constitutional:      General: She is not in acute distress.  Appearance: She is well-developed.  HENT:     Head: Normocephalic and atraumatic.     Comments: 0.5cm circular area of induration, no drainage, warmth or erythema, on L upper cheek just below eye; no periorbital swelling    Nose: Nose normal.     Mouth/Throat:     Mouth: Mucous membranes are moist.     Pharynx: Oropharynx is clear.  Eyes:     Conjunctiva/sclera: Conjunctivae normal.     Pupils: Pupils are equal, round, and reactive to light.  Neck:     Musculoskeletal: Neck supple.  Cardiovascular:     Rate and Rhythm: Normal rate and regular rhythm.     Heart sounds: Normal heart sounds. No murmur.  Pulmonary:     Effort: Pulmonary effort is normal.     Breath sounds: Normal breath sounds.  Abdominal:     General: Bowel sounds are normal. There is no distension.     Palpations:  Abdomen is soft.     Tenderness: There is no abdominal tenderness.  Skin:    General: Skin is warm and dry.  Neurological:     Mental Status: She is alert and oriented to person, place, and time.     Gait: Gait normal.     Comments: Fluent speech  Psychiatric:        Judgment: Judgment normal.      ED Treatments / Results  Labs (all labs ordered are listed, but only abnormal results are displayed) Labs Reviewed  CBC WITH DIFFERENTIAL/PLATELET - Abnormal; Notable for the following components:      Result Value   WBC 2.7 (*)    RBC 3.66 (*)    Hemoglobin 10.8 (*)    HCT 33.9 (*)    Neutro Abs 1.1 (*)    All other components within normal limits  OCCULT BLOOD X 1 CARD TO LAB, STOOL - Abnormal; Notable for the following components:   Fecal Occult Bld POSITIVE (*)    All other components within normal limits  GASTROINTESTINAL PANEL BY PCR, STOOL (REPLACES STOOL CULTURE)  PREGNANCY, URINE  COMPREHENSIVE METABOLIC PANEL  LIPASE, BLOOD    EKG None  Radiology No results found.  Procedures Procedures (including critical care time)  Medications Ordered in ED Medications - No data to display   Initial Impression / Assessment and Plan / ED Course  I have reviewed the triage vital signs and the nursing notes.  Pertinent labs & imaging results that were available during my care of the patient were reviewed by me and considered in my medical decision making (see chart for details).      Well-appearing, normal vital signs, no focal abdominal tenderness on exam. Normal CMP, WBC 2.7 and Hgb 10.8 similar to previous labs. UPT negative. GI panel is pending.  It is possible that she has developed diarrhea associated with antibiotic use, differential includes C. difficile but diarrhea has improved since discontinuation of the antibiotics which makes this less likely.  She will be contacted if GI panel is positive.  Because she has no abdominal tenderness, I do not feel that she needs  CT at this time but I have recommended that she follow-up with GI if her symptoms do not improve with supportive measures.  Will switch the patient to doxycycline.  She still has a small area of firmness on her left cheek.  She reports that the area is unchanged in size but her adjacent eye swelling has improved which would suggest that she is perhaps spontaneously  improving.  However, because she still has an area of induration, I have advised that she contact ENT for an appointment ASAP for evaluation.  I have extensively reviewed return precautions with her regarding both face and bloody diarrhea.  She has voiced understanding.  Final Clinical Impressions(s) / ED Diagnoses   Final diagnoses:  Bloody diarrhea  Facial abscess    ED Discharge Orders         Ordered    doxycycline (VIBRAMYCIN) 100 MG capsule  2 times daily     08/09/18 1037           Little, Ambrose Finland, MD 08/09/18 1106

## 2018-08-10 ENCOUNTER — Telehealth (HOSPITAL_BASED_OUTPATIENT_CLINIC_OR_DEPARTMENT_OTHER): Payer: Self-pay | Admitting: *Deleted

## 2019-05-03 ENCOUNTER — Emergency Department (HOSPITAL_BASED_OUTPATIENT_CLINIC_OR_DEPARTMENT_OTHER)
Admission: EM | Admit: 2019-05-03 | Discharge: 2019-05-03 | Disposition: A | Discharge status: Home / Self Care | Payer: Managed Care, Other (non HMO) | Attending: Emergency Medicine | Admitting: Emergency Medicine

## 2019-05-03 ENCOUNTER — Encounter (HOSPITAL_BASED_OUTPATIENT_CLINIC_OR_DEPARTMENT_OTHER): Payer: Self-pay | Admitting: Emergency Medicine

## 2019-05-03 ENCOUNTER — Other Ambulatory Visit: Payer: Self-pay

## 2019-05-03 DIAGNOSIS — L0231 Cutaneous abscess of buttock: Secondary | ICD-10-CM | POA: Diagnosis present

## 2019-05-03 DIAGNOSIS — Z87891 Personal history of nicotine dependence: Secondary | ICD-10-CM | POA: Insufficient documentation

## 2019-05-03 MED ORDER — MUPIROCIN CALCIUM 2 % NA OINT: TOPICAL_OINTMENT | NASAL | 1 refills | Status: AC

## 2019-05-03 MED ORDER — LIDOCAINE-EPINEPHRINE (PF) 2 %-1:200000 IJ SOLN
10.0000 mL | Freq: Once | INTRAMUSCULAR | Status: AC
  Administered 2019-05-03: 10 mL
  Filled 2019-05-03: qty 10

## 2019-05-03 MED ORDER — SULFAMETHOXAZOLE-TRIMETHOPRIM 800-160 MG PO TABS: 1.0000 | ORAL_TABLET | Freq: Two times a day (BID) | ORAL | 0 refills | Status: DC

## 2019-05-03 MED ORDER — DOXYCYCLINE HYCLATE 100 MG PO CAPS: 100.0000 mg | ORAL_CAPSULE | Freq: Two times a day (BID) | ORAL | 0 refills | Status: AC

## 2019-05-03 MED FILL — DOXYCYCLINE HYCLATE 100 MG: 100 | 7 days supply | Qty: 14 | Fill #0

## 2019-05-03 MED FILL — MUPIROCIN 2% OINTMENT: 2 | 14 days supply | Qty: 22 | Fill #0

## 2019-05-03 MED FILL — SULFAMETHOXAZOLE-TMP DS TAB: 800-160 | 7 days supply | Qty: 14 | Fill #0

## 2019-05-03 NOTE — ED Provider Notes (Signed)
[facility] EMERGENCY DEPARTMENT Provider Note   CSN: [id number] Arrival date & time: 05/03/19  8185     History   Chief Complaint Chief Complaint  Patient presents with  . Abscess    HPI Signora Zucco is a 28 y.o. female.     Patient presents to the emergency department with complaint of boil to the left buttock starting approximately 1 week ago.  Patient has applied warm compress, perform warm soaks, and applied Epson salt without any improvement.  She has had previous boils.  No fevers, nausea, vomiting.  No other treatments prior to arrival.     Past Medical History:  Diagnosis Date  . GERD (gastroesophageal reflux disease)     There are no active problems to display for this patient.   Past Surgical History:  Procedure Laterality Date  . HERNIA REPAIR    . MOUTH SURGERY    . UPPER GI ENDOSCOPY       OB History   No obstetric history on file.      Home Medications    Prior to Admission medications   Not on File    Family History No family history on file.  Social History Social History   Tobacco Use  . Smoking status: Former Smoker    Types: Cigars  . Smokeless tobacco: Never Used  Substance Use Topics  . Alcohol use: No  . Drug use: No     Allergies   Patient has no known allergies.   Review of Systems Review of Systems  Constitutional: Negative for fever.  Gastrointestinal: Negative for nausea and vomiting.  Skin: Negative for color change.       Positive for abscess.  Hematological: Negative for adenopathy.     Physical Exam Updated Vital Signs BP 109/70 (BP Location: Right Arm)   Pulse 85   Temp 98.4 F (36.9 C) (Oral)   Resp 16   Ht 5\' 3"  (1.6 m)   Wt 44.2 kg   LMP 04/16/2019   SpO2 100%   BMI 17.25 kg/m   Physical Exam Vitals signs and nursing note reviewed.  Constitutional:      Appearance: She is well-developed.  HENT:     Head: Normocephalic and atraumatic.  Eyes:     Conjunctiva/sclera:  Conjunctivae normal.  Neck:     Musculoskeletal: Normal range of motion and neck supple.  Pulmonary:     Effort: No respiratory distress.  Skin:    General: Skin is warm and dry.     Comments: 4cm area of induration inferior L buttocks consistent with abscess.   Neurological:     Mental Status: She is alert.      ED Treatments / Results  Labs (all labs ordered are listed, but only abnormal results are displayed) Labs Reviewed - No data to display  EKG None  Radiology No results found.  Procedures .13/10/2020Incision and Drainage  Date/Time: 05/03/2019 9:34 AM Performed by: 05/05/2019, PA-C Authorized by: Renne Crigler, PA-C   Consent:    Consent obtained:  Verbal   Consent given by:  Patient   Risks discussed:  Pain, bleeding and incomplete drainage   Alternatives discussed:  No treatment Location:    Type:  Abscess   Size:  4cm   Location:  Lower extremity   Lower extremity location:  Buttock   Buttock location:  L buttock Pre-procedure details:    Skin preparation:  Betadine Anesthesia (see MAR for exact dosages):    Anesthesia method:  Local  infiltration   Local anesthetic:  Lidocaine 2% WITH epi Procedure type:    Complexity:  Simple Procedure details:    Needle aspiration: no     Incision types:  Stab incision   Incision depth:  Dermal   Scalpel blade:  11   Wound management:  Probed and deloculated   Drainage:  Purulent   Drainage amount:  Copious   Wound treatment:  Wound left open   Packing materials:  None Post-procedure details:    Patient tolerance of procedure:  Tolerated well, no immediate complications Comments:     Patient tolerated procedure but with considerable pain.  I was not able to make as wide open incision or drain as much purulent material as I would have liked due to patient intolerance.  Amount of induration and swelling much improved after procedure.   (including critical care time)  Medications Ordered in ED Medications   lidocaine-EPINEPHrine (XYLOCAINE W/EPI) 2 %-1:200000 (PF) injection 10 mL (10 mLs Infiltration Given 05/03/19 0918)     Initial Impression / Assessment and Plan / ED Course  I have reviewed the triage vital signs and the nursing notes.  Pertinent labs & imaging results that were available during my care of the patient were reviewed by me and considered in my medical decision making (see chart for details).        Patient seen and examined.   Vital signs reviewed and are as follows: BP 109/70 (BP Location: Right Arm)   Pulse 85   Temp 98.4 F (36.9 C) (Oral)   Resp 16   Ht 5\' 3"  (1.6 m)   Wt 44.2 kg   LMP 04/16/2019   SpO2 100%   BMI 17.25 kg/m   Patient examined with RN chaperone.    9:35 AM The patient was urged to return to the Emergency Department urgently with worsening pain, swelling, expanding erythema especially if it streaks away from the affected area, fever, or if they have any other concerns.   The patient was urged to return to the Emergency Department or go to their PCP in 48 hours for wound recheck if the area is not significantly improved.  The patient verbalized understanding and stated agreement with this plan.     Final Clinical Impressions(s) / ED Diagnoses   Final diagnoses:  Abscess of left buttock   Patient with left buttocks abscess, drained as above.  Patient started on Bactrim.  Discussed decontamination measures.  Encouraged return with above worsening.  ED Discharge Orders         Ordered    sulfamethoxazole-trimethoprim (BACTRIM DS) 800-160 MG tablet  2 times daily     05/03/19 0930    mupirocin nasal ointment (BACTROBAN) 2 %     05/03/19 0930           Carlisle Cater, PA-C 05/03/19 Hardin, [place], DO 05/03/19 1249

## 2019-05-03 NOTE — Discharge Instructions (Signed)
Please read and follow all provided instructions.  Your diagnoses today include:  1. Abscess of left buttock     Tests performed today include:  Vital signs. See below for your results today.   Medications prescribed:   Bactrim (trimethoprim/sulfamethoxazole) - antibiotic  You have been prescribed an antibiotic medicine: take the entire course of medicine even if you are feeling better. Stopping early can cause the antibiotic not to work.  Take any prescribed medications only as directed.   Home care instructions:   Follow any educational materials contained in this packet  Follow-up instructions: Return to the Emergency Department in 48 hours for a recheck if your symptoms are not significantly improved.  Please follow-up with your primary care provider in the next 1 week for further evaluation of your symptoms.   Return instructions:  Return to the Emergency Department if you have:  Fever  Worsening symptoms  Worsening pain  Worsening swelling  Redness of the skin that moves away from the affected area, especially if it streaks away from the affected area   Any other emergent concerns  Additional Information: If you have recurrent abscesses, try both the following. Use a Qtip to apply an over-the-counter antibiotic to the inside of your nostrils, twice a day for 5 days. Wash your body with over-the-counter Hibaclens once a day for one week and then once every two weeks. This can reduce the amount of bacterial on your skin that causes boils and lead to fewer boils. If you continue to have multiple or recurrent boils, you should see a dermatologist (skin doctor).   Your vital signs today were: BP 109/70 (BP Location: Right Arm)    Pulse 85    Temp 98.4 F (36.9 C) (Oral)    Resp 16    Ht 5\' 3"  (1.6 m)    Wt 44.2 kg    LMP 04/16/2019    SpO2 100%    BMI 17.25 kg/m  If your blood pressure (BP) was elevated above 135/85 this visit, please have this repeated by your  doctor within one month. --------------

## 2019-05-03 NOTE — ED Notes (Signed)
ED Provider at bedside. 

## 2019-05-03 NOTE — ED Triage Notes (Signed)
Abscess on buttock x1 week.

## 2019-05-03 NOTE — ED Notes (Addendum)
ED Provider at bedside for I&D.  Nurse Chaperone present.
